# Patient Record
Sex: Male | Born: 1990 | Race: White | Hispanic: No | Marital: Single | Smoking: Former smoker
Health system: Southern US, Community
[De-identification: ages and names within clinical notes are randomized; demographics above are authoritative.]

---
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
## 2012-12-17 ENCOUNTER — Emergency Department: Payer: Self-pay | Admitting: Internal Medicine

## 2012-12-17 LAB — CBC
HCT: 42.6 % (ref 40.0–52.0)
HGB: 14.4 g/dL (ref 13.0–18.0)
MCH: 30.1 pg (ref 26.0–34.0)
MCHC: 33.8 g/dL (ref 32.0–36.0)
MCV: 89 fL (ref 80–100)
Platelet: 167 10*3/uL (ref 150–440)
RBC: 4.78 10*6/uL (ref 4.40–5.90)
RDW: 13.4 % (ref 11.5–14.5)
WBC: 5.8 10*3/uL (ref 3.8–10.6)

## 2012-12-17 LAB — URINALYSIS, COMPLETE
Bacteria: NONE SEEN
Bilirubin,UR: NEGATIVE
Blood: NEGATIVE
Glucose,UR: NEGATIVE mg/dL (ref 0–75)
Ketone: NEGATIVE
Leukocyte Esterase: NEGATIVE
Nitrite: NEGATIVE
Ph: 8 (ref 4.5–8.0)
Protein: NEGATIVE
RBC,UR: <1 /HPF (ref 0–5)
Specific Gravity: 1.019 (ref 1.003–1.030)
Squamous Epithelial: NONE SEEN
WBC UR: <1 /HPF (ref 0–5)

## 2012-12-17 LAB — COMPREHENSIVE METABOLIC PANEL
Albumin: 5 g/dL (ref 3.4–5.0)
Alkaline Phosphatase: 96 U/L (ref 50–136)
Anion Gap: 5 — ABNORMAL LOW (ref 7–16)
BUN: 11 mg/dL (ref 7–18)
Bilirubin,Total: 0.8 mg/dL (ref 0.2–1.0)
Calcium, Total: 9.8 mg/dL (ref 8.5–10.1)
Chloride: 106 mmol/L (ref 98–107)
Co2: 29 mmol/L (ref 21–32)
Creatinine: 1.04 mg/dL (ref 0.60–1.30)
EGFR (African American): >60
EGFR (Non-African Amer.): >60
Glucose: 106 mg/dL — ABNORMAL HIGH (ref 65–99)
Osmolality: 279 (ref 275–301)
Potassium: 4.1 mmol/L (ref 3.5–5.1)
SGOT(AST): 36 U/L (ref 15–37)
SGPT (ALT): 54 U/L (ref 12–78)
Sodium: 140 mmol/L (ref 136–145)
Total Protein: 8.3 g/dL — ABNORMAL HIGH (ref 6.4–8.2)

## 2013-11-29 ENCOUNTER — Emergency Department (HOSPITAL_COMMUNITY): Payer: Self-pay

## 2013-11-29 ENCOUNTER — Emergency Department (HOSPITAL_COMMUNITY)
Admission: EM | Admit: 2013-11-29 | Discharge: 2013-11-29 | Disposition: A | Discharge status: Home / Self Care | Payer: Self-pay | Attending: Emergency Medicine | Admitting: Emergency Medicine

## 2013-11-29 ENCOUNTER — Encounter (HOSPITAL_COMMUNITY): Payer: Self-pay | Admitting: Emergency Medicine

## 2013-11-29 DIAGNOSIS — IMO0002 Reserved for concepts with insufficient information to code with codable children: Secondary | ICD-10-CM | POA: Insufficient documentation

## 2013-11-29 DIAGNOSIS — M545 Low back pain, unspecified: Secondary | ICD-10-CM

## 2013-11-29 DIAGNOSIS — Y9389 Activity, other specified: Secondary | ICD-10-CM | POA: Insufficient documentation

## 2013-11-29 DIAGNOSIS — Y92410 Unspecified street and highway as the place of occurrence of the external cause: Secondary | ICD-10-CM | POA: Insufficient documentation

## 2013-11-29 DIAGNOSIS — S199XXA Unspecified injury of neck, initial encounter: Secondary | ICD-10-CM

## 2013-11-29 DIAGNOSIS — F17200 Nicotine dependence, unspecified, uncomplicated: Secondary | ICD-10-CM | POA: Insufficient documentation

## 2013-11-29 DIAGNOSIS — S060X9A Concussion with loss of consciousness of unspecified duration, initial encounter: Secondary | ICD-10-CM

## 2013-11-29 DIAGNOSIS — S060X0A Concussion without loss of consciousness, initial encounter: Secondary | ICD-10-CM | POA: Insufficient documentation

## 2013-11-29 DIAGNOSIS — S060XAA Concussion with loss of consciousness status unknown, initial encounter: Secondary | ICD-10-CM

## 2013-11-29 DIAGNOSIS — S0993XA Unspecified injury of face, initial encounter: Secondary | ICD-10-CM | POA: Insufficient documentation

## 2013-11-29 DIAGNOSIS — G988 Other disorders of nervous system: Secondary | ICD-10-CM | POA: Insufficient documentation

## 2013-11-29 MED ORDER — CYCLOBENZAPRINE HCL 10 MG PO TABS: 10.0000 mg | ORAL_TABLET | Freq: Two times a day (BID) | ORAL | Status: DC | PRN

## 2013-11-29 MED ORDER — TRAMADOL HCL 50 MG PO TABS: 50.0000 mg | ORAL_TABLET | Freq: Four times a day (QID) | ORAL | Status: DC | PRN
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
## 2013-11-29 NOTE — ED Notes (Addendum)
Pt c/o of dizziness when standing- denies changes in vision.  Alert and oriented X 4- PA aware.  PERRLA.  Acuity changed.
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
## 2013-11-30 NOTE — ED Provider Notes (Signed)
Medical screening examination/treatment/procedure(s) were performed by non-physician practitioner and as supervising physician I was immediately available for consultation/collaboration.     Klint Lezcano, MD 11/30/13 0714 

## 2014-03-08 ENCOUNTER — Emergency Department (HOSPITAL_COMMUNITY)
Admission: EM | Admit: 2014-03-08 | Discharge: 2014-03-08 | Disposition: A | Discharge status: Home / Self Care | Payer: Self-pay | Attending: Emergency Medicine | Admitting: Emergency Medicine

## 2014-03-08 ENCOUNTER — Encounter (HOSPITAL_COMMUNITY): Payer: Self-pay | Admitting: Emergency Medicine

## 2014-03-08 DIAGNOSIS — S90569A Insect bite (nonvenomous), unspecified ankle, initial encounter: Secondary | ICD-10-CM | POA: Insufficient documentation

## 2014-03-08 DIAGNOSIS — Y939 Activity, unspecified: Secondary | ICD-10-CM | POA: Insufficient documentation

## 2014-03-08 DIAGNOSIS — F17200 Nicotine dependence, unspecified, uncomplicated: Secondary | ICD-10-CM | POA: Insufficient documentation

## 2014-03-08 DIAGNOSIS — S80869A Insect bite (nonvenomous), unspecified lower leg, initial encounter: Secondary | ICD-10-CM

## 2014-03-08 DIAGNOSIS — D692 Other nonthrombocytopenic purpura: Secondary | ICD-10-CM | POA: Insufficient documentation

## 2014-03-08 DIAGNOSIS — W57XXXA Bitten or stung by nonvenomous insect and other nonvenomous arthropods, initial encounter: Secondary | ICD-10-CM

## 2014-03-08 DIAGNOSIS — Y929 Unspecified place or not applicable: Secondary | ICD-10-CM | POA: Insufficient documentation

## 2014-03-08 MED ORDER — DOXYCYCLINE HYCLATE 100 MG PO CAPS: ORAL_CAPSULE | ORAL | Status: DC

## 2014-03-08 NOTE — ED Provider Notes (Signed)
CSN: [id number]     Arrival date & time 03/08/14  0957 History   First MD Initiated Contact with Patient 03/08/14 1116     Chief Complaint  Patient presents with  . Rash    Feet     (Consider location/radiation/quality/duration/timing/severity/associated sxs/prior Treatment) Patient is a 23 y.o. male presenting with rash. The history is provided by the patient. No language interpreter was used.  Rash Location:  Foot and leg Leg rash location:  R ankle and L ankle Foot rash location:  R foot Quality comment:  Non-blanching. purpuric Severity:  Mild Onset quality:  Sudden Duration:  2 days Timing:  Constant Progression:  Worsening Chronicity:  New Context: not animal contact, not chemical exposure, not diapers, not eggs, not exposure to similar rash, not food, not hot tub use, not insect bite/sting, not medications, not new detergent/soap, not nuts, not plant contact, not pollen, not pregnancy, not sick contacts and not sun exposure   Context comment:  [place] bite 1 week ago Relieved by:  Nothing Worsened by:  Nothing tried Associated symptoms: no abdominal pain, no diarrhea, no fatigue, no fever, no headaches, no hoarse voice, no induration, no joint pain, no myalgias, no nausea, no periorbital edema, no shortness of breath, no sore throat, no throat swelling, no tongue swelling, no URI, not vomiting and not wheezing     History reviewed. No pertinent past medical history. History reviewed. No pertinent past surgical history. No family history on file. History  Substance Use Topics  . Smoking status: Current Every Day Smoker  . Smokeless tobacco: Not on file  . Alcohol Use: Yes    Review of Systems  Constitutional: Negative for fever, chills and fatigue.  HENT: Negative for hoarse voice and sore throat.   Respiratory: Negative for cough, shortness of breath and wheezing.   Cardiovascular: Negative for chest pain and palpitations.  Gastrointestinal: Negative for nausea,  vomiting, abdominal pain, diarrhea and constipation.  Genitourinary: Negative for dysuria, urgency and frequency.  Musculoskeletal: Negative for arthralgias and myalgias.  Skin: Positive for rash.  Neurological: Negative for headaches.      Allergies  Review of patient's allergies indicates no known allergies.  Home Medications   Prior to Admission medications   Not on File   BP 129/103  Pulse 86  Temp(Src) 98.2 F (36.8 C) (Oral)  Resp 18  SpO2 100% Physical Exam  Nursing note and vitals reviewed. Constitutional: He appears well-developed and well-nourished. No distress.  HENT:  Head: Normocephalic and atraumatic.  Eyes: Conjunctivae are normal. No scleral icterus.  Neck: Normal range of motion. Neck supple.  Cardiovascular: Normal rate, regular rhythm and normal heart sounds.   Pulmonary/Chest: Effort normal and breath sounds normal. No respiratory distress.  Abdominal: Soft. There is no tenderness.  Musculoskeletal: He exhibits no edema.  Neurological: He is alert.  Skin: Skin is warm and dry. He is not diaphoretic.  Several poorly demarcated, non-blanchable 0.5-1cm lesions on the Left ankle.  Petechiae present distally. 2 lesions on the  Right ankle.  Psychiatric: His behavior is normal.    ED Course  Procedures (including critical care time) Labs Review Labs Reviewed - No data to display  Imaging Review No results found.   EKG Interpretation None      MDM   Final diagnoses:  Tick bite of calf  Purpura    BP 129/103  Pulse 86  Temp(Src) 98.2 F (36.8 C) (Oral)  Resp 18  SpO2 100% 11:50 AM Patient afebrile. Documented pressure  is thought to be erroneous. Retested with cuff place appropriately and found to be 134/78.    11:55 AM BP 129/103  Pulse 86  Temp(Src) 98.2 F (36.8 C) (Oral)  Resp 18  SpO2 100% I discussed the case with my attending physician Dr. Ethelda ChickJacubowitz. Will treat fro [facility]F. I also spoke wit Dr. Elmon KirschnerHatcher oncall for  infectious disease about dosing. WIll treat patient with doxycycline 100mg  BID x14 days.   Arthor Captainbigail Cyera Balboni, PA-C 03/08/14 1158

## 2014-03-08 NOTE — ED Notes (Signed)
Patient waiting for case management to come speak with him about medication alternatives for antibiotic.

## 2014-03-08 NOTE — ED Provider Notes (Signed)
Medical screening examination/treatment/procedure(s) were performed by non-physician practitioner and as supervising physician I was immediately available for consultation/collaboration.   EKG Interpretation None       Doug SouSam Tavio Biegel, MD 03/08/14 2048

## 2014-03-08 NOTE — Discharge Planning (Signed)
[facility] Community Liaison  Spoke to patient about primary care resources and establishing care with a provider. Patient sts he will be obtaining insurance through his employer in the upcoming months. Resource guide and my contact information was provided.

## 2014-03-08 NOTE — ED Notes (Signed)
Pt found tick on him on him last Sunday to left lower posterior leg.  Pt removed it and then had vomiting on Monday.  Pt states noted rash to bilateral ankle areas after tick removal.  Pt states not feeling different, concerned about [place] fever

## 2014-03-08 NOTE — Discharge Instructions (Signed)
Tick Bite Information Ticks are insects that attach themselves to the skin and draw blood for food. There are various types of ticks. Common types include wood ticks and deer ticks. Most ticks live in shrubs and grassy areas. Ticks can climb onto your body when you make contact with leaves or grass where the tick is waiting. The most common places on the body for ticks to attach themselves are the scalp, neck, armpits, waist, and groin. Most tick bites are harmless, but sometimes ticks carry germs that cause diseases. These germs can be spread to a person during the tick's feeding process. The chance of a disease spreading through a tick bite depends on:  The type of tick. Time of year.  How long the tick is attached.  Geographic location.  HOW CAN YOU PREVENT TICK BITES? Take these steps to help prevent tick bites when you are outdoors: Wear protective clothing. Long sleeves and long pants are best.  Wear white clothes so you can see ticks more easily. Tuck your pant legs into your socks.  If walking on a trail, stay in the middle of the trail to avoid brushing against bushes. Avoid walking through areas with long grass. Put insect repellent on all exposed skin and along boot tops, pant legs, and sleeve cuffs.  Check clothing, hair, and skin repeatedly and before going inside.  Brush off any ticks that are not attached. Take a shower or bath as soon as possible after being outdoors.  WHAT IS THE PROPER WAY TO REMOVE A TICK? Ticks should be removed as soon as possible to help prevent diseases caused by tick bites. If latex gloves are available, put them on before trying to remove a tick.  Using fine-point tweezers, grasp the tick as close to the skin as possible. You may also use curved forceps or a tick removal tool. Grasp the tick as close to its head as possible. Avoid grasping the tick on its body. Pull gently with steady upward pressure until the tick lets go. Do not twist the  tick or jerk it suddenly. This may break off the tick's head or mouth parts. Do not squeeze or crush the tick's body. This could force disease-carrying fluids from the tick into your body.  After the tick is removed, wash the bite area and your hands with soap and water or other disinfectant such as alcohol. Apply a small amount of antiseptic cream or ointment to the bite site.  Wash and disinfect any instruments that were used.  Do not try to remove a tick by applying a hot match, petroleum jelly, or fingernail polish to the tick. These methods do not work and may increase the chances of disease being spread from the tick bite.  WHEN SHOULD YOU SEEK MEDICAL CARE? Contact your health care provider if you are unable to remove a tick from your skin or if a part of the tick breaks off and is stuck in the skin.  After a tick bite, you need to be aware of signs and symptoms that could be related to diseases spread by ticks. Contact your health care provider if you develop any of the following in the days or weeks after the tick bite: Unexplained fever. Rash. A circular rash that appears days or weeks after the tick bite may indicate the possibility of Lyme disease. The rash may resemble a target with a bull's-eye and may occur at a different part of your body than the tick bite. Redness and swelling  in the area of the tick bite.  Tender, swollen lymph glands.  Diarrhea.  Weight loss.  Cough.  Fatigue.  Muscle, joint, or bone pain.  Abdominal pain.  Headache.  Lethargy or a change in your level of consciousness. Difficulty walking or moving your legs.  Numbness in the legs.  Paralysis. Shortness of breath.  Confusion.  Repeated vomiting.  Document Released: 10/11/2000 Document Revised: 08/04/2013 Document Reviewed: 03/24/2013 [facility] Patient Information 2014 [place]. [place] Spotted Fever [place] Spotted Fever (RMSF) is the oldest known tick-borne  disease of people in the [place]. This disease was named because it was first described among people in the [facility] area who had an illness characterized by a rash with red-purple-black spots. This disease is caused by a rickettsia (Rickettsia rickettsii), a bacteria carried by the tick. [facility] wood tick and the American dog tick, acquire and transmit the RMSF bacteria (pictures NOT actual size). When a larval, nymphal or adult tick feeds on an infected rodent or larger animal, the tick can become infected. Infected adult ticks then feed on people who may then get RMSF. The tick transmits the disease to humans during a prolonged period of feeding that lasts many hours, days or even a couple weeks. The bite is painless and frequently goes unnoticed. An infected male tick may also pass the rickettsial bacteria to her eggs that then may mature to be infected adult ticks. The rickettsia that causes RMSF can also get into a person's body through damaged skin. A tick bite is not necessary. People can get RMSF if they crush a tick and get it's blood or body fluids on their skin through a small cut or sore.  DIAGNOSIS Diagnosis is made by laboratory tests.  TREATMENT Treatment is with antibiotics (medications that kill rickettsia and other bacteria). Immediate treatment usually prevents death. GEOGRAPHIC RANGE This disease was reported only in the [facility] until 1931. RMSF has more recently been described among individuals in all states except [place], [place] and [place]. The highest reported incidences of RMSF now occur among residents of [place], [place], [place] and the [place]. TIME OF YEAR  Most cases are diagnosed during late spring and summer when ticks are most active. However, especially in the warmer [place] states, a few cases occur during the winter. SYMPTOMS   Symptoms of RMSF begin from 2 to 14 days after a tick bite. The most common early symptoms are fever,  muscle aches and headache followed by nausea (feeling sick to your stomach) or vomiting.  The RMSF rash is typically delayed until 3 or more days after symptom onset, and eventually develops in 9 of 10 infected patients by the 5th day of illness. If the disease is not treated it can cause death. If you get a fever, headache, muscle aches, rash, nausea or vomiting within 2 weeks of a possible tick bite or exposure you should see your caregiver immediately. PREVENTION Ticks prefer to hide in shady, moist ground litter. They can often be found above the ground clinging to tall grass, brush, shrubs and low tree branches. They also inhabit lawns and gardens, especially at the edges of woodlands and around old stone walls. Within the areas where ticks generally live, no naturally vegetated area can be considered completely free of infected ticks. The best precaution against RMSF is to avoid contact with soil, leaf litter and vegetation as much as possible in tick infested areas. For those who enjoy gardening or walking in their  yards, clear brush and mow tall grass around houses and at the edges of gardens. This may help reduce the tick population in the immediate area. Applications of chemical insecticides by a licensed professional in the spring (late May) and Fall (September) will also control ticks, especially in heavily infested areas. Treatment will never get rid of all the ticks. Getting rid of small animal populations that host ticks will also decrease the tick population. When working in the garden, [facility], or handling soil and vegetation, wear light-colored protective clothing and gloves. Spot-check often to prevent ticks from reaching the skin. Ticks cannot jump or fly. They will not drop from an above-ground perch onto a passing animal. Once a tick gains access to human skin it climbs upward until it reaches a more protected area. For example, the back of the knee, groin, navel, armpit, ears or  nape of the neck. It then begins the slow process of embedding itself in the skin. Campers, hikers, field workers, and others who spend time in wooded, brushy or tall grassy areas can avoid exposure to ticks by using the following precautions:  Wear light-colored clothing with a tight weave to spot ticks more easily and prevent contact with the skin.  Wear long pants tucked into socks, long-sleeved shirts tucked into pants and enclosed shoes or boots along with insect repellent.  Spray clothes with insect repellent containing either DEET or Permethrin. Only DEET can be used on exposed skin. Follow the manufacturer's directions carefully.  Wear a hat and keep long hair pulled back.  Stay on cleared, well-worn trails whenever possible.  Spot-check yourself and others often for the presence of ticks on clothes. If you find one, there are likely to be others. Check thoroughly.  Remove clothes after leaving tick-infested areas. If possible, wash them to eliminate any unseen ticks. Check yourself, your children and any pets from head to toe for the presence of ticks.  Shower and shampoo. You can greatly reduce your chances of contracting RMSF if you remove attached ticks as soon as possible. Regular checks of the body, including all body sites covered by hair (head, armpits, genitals), allow removal of the tick before rickettsial transmission. To remove an attached tick, use a forceps or tweezers to detach the intact tick without leaving mouth parts in the skin. The tick bite wound should be cleansed after tick removal. Remember the most common symptoms of RMSF are fever, muscle aches, headache and nausea or vomiting with a later onset of rash. If you get these symptoms after a tick bite and while living in an area where RMSF is found, RMSF should be suspected. If the disease is not treated, it can cause death. See your caregiver immediately if you get these symptoms. Do this even if not aware of a tick  bite. Document Released: 01/26/2001 Document Revised: 01/06/2012 Document Reviewed: 09/18/2009 [facility] Patient Information 2014 [place], [place]. Doxycycline tablets or capsules What is this medicine? DOXYCYCLINE (dox i SYE kleen) is a tetracycline antibiotic. It kills certain bacteria or stops their growth. It is used to treat many kinds of infections, like dental, skin, respiratory, and urinary tract infections. It also treats acne, Lyme disease, malaria, and certain sexually transmitted infections. This medicine may be used for other purposes; ask your health care provider or pharmacist if you have questions. COMMON BRAND NAME(S): Adoxa CK, Adoxa Pak, Adoxa TT, Adoxa, Alodox, Avidoxy, Doxal, Monodox, Morgidox 1x Kit, Morgidox 1x, Morgidox 2x , Morgidox 2x Kit, Ocudox , Vibra-Tabs, Vibramycin  What should I tell my health care provider before I take this medicine? They need to know if you have any of these conditions: -liver disease -long exposure to sunlight like working outdoors -stomach problems like colitis -an unusual or allergic reaction to doxycycline, tetracycline antibiotics, other medicines, foods, dyes, or preservatives -pregnant or trying to get pregnant -breast-feeding How should I use this medicine? Take this medicine by mouth with a full glass of water. Follow the directions on the prescription label. It is best to take this medicine without food, but if it upsets your stomach take it with food. Take your medicine at regular intervals. Do not take your medicine more often than directed. Take all of your medicine as directed even if you think you are better. Do not skip doses or stop your medicine early. Talk to your pediatrician regarding the use of this medicine in children. Special care may be needed. While this drug may be prescribed for children as young as 66 years old for selected conditions, precautions do apply. Overdosage: If you think you have taken too much of this  medicine contact a poison control center or emergency room at once. NOTE: This medicine is only for you. Do not share this medicine with others. What if I miss a dose? If you miss a dose, take it as soon as you can. If it is almost time for your next dose, take only that dose. Do not take double or extra doses. What may interact with this medicine? -antacids -barbiturates -birth control pills -bismuth subsalicylate -carbamazepine -methoxyflurane -other antibiotics -phenytoin -vitamins that contain iron -warfarin This list may not describe all possible interactions. Give your health care provider a list of all the medicines, herbs, non-prescription drugs, or dietary supplements you use. Also tell them if you smoke, drink alcohol, or use illegal drugs. Some items may interact with your medicine. What should I watch for while using this medicine? Tell your doctor or health care professional if your symptoms do not improve. Do not treat diarrhea with over the counter products. Contact your doctor if you have diarrhea that lasts more than 2 days or if it is severe and watery. Do not take this medicine just before going to bed. It may not dissolve properly when you lay down and can cause pain in your throat. Drink plenty of fluids while taking this medicine to also help reduce irritation in your throat. This medicine can make you more sensitive to the sun. Keep out of the sun. If you cannot avoid being in the sun, wear protective clothing and use sunscreen. Do not use sun lamps or tanning beds/booths. Birth control pills may not work properly while you are taking this medicine. Talk to your doctor about using an extra method of birth control. If you are being treated for a sexually transmitted infection, avoid sexual contact until you have finished your treatment. Your sexual partner may also need treatment. Avoid antacids, aluminum, calcium, magnesium, and iron products for 4 hours before and 2  hours after taking a dose of this medicine. If you are using this medicine to prevent malaria, you should still protect yourself from contact with mosquitos. Stay in screened-in areas, use mosquito nets, keep your body covered, and use an insect repellent. What side effects may I notice from receiving this medicine? Side effects that you should report to your doctor or health care professional as soon as possible: -allergic reactions like skin rash, itching or hives, swelling of the face, lips, or  tongue -difficulty breathing -fever -itching in the rectal or genital area -pain on swallowing -redness, blistering, peeling or loosening of the skin, including inside the mouth -severe stomach pain or cramps -unusual bleeding or bruising -unusually weak or tired -yellowing of the eyes or skin Side effects that usually do not require medical attention (report to your doctor or health care professional if they continue or are bothersome): -diarrhea -loss of appetite -nausea, vomiting This list may not describe all possible side effects. Call your doctor for medical advice about side effects. You may report side effects to FDA at 1-800-FDA-1088. Where should I keep my medicine? Keep out of the reach of children. Store at room temperature, below 30 degrees C (86 degrees F). Protect from light. Keep container tightly closed. Throw away any unused medicine after the expiration date. Taking this medicine after the expiration date can make you seriously ill. NOTE: This sheet is a summary. It may not cover all possible information. If you have questions about this medicine, talk to your doctor, pharmacist, or health care provider.  2014, Elsevier/[employer] Standard. (2008-02-02 16:53:02)

## 2014-03-08 NOTE — Progress Notes (Signed)
  CARE MANAGEMENT ED NOTE 03/08/2014  Patient:  Andrew Brewer,Andrew G   Account Number:  [id number]  Date Initiated:  03/08/2014  Documentation initiated by:  Ferdinand CavaSCHETTINO,Rodolph Hagemann  Subjective/Objective Assessment:   23 yo male presenting to the ED with an insect bite     Subjective/Objective Assessment Detail:   Patient is a 23 y.o. male presenting with rash. The history is provided by the patient. No language interpreter was used     Action/Plan:   Action/Plan Detail:   Anticipated DC Date:       Status Recommendation to Physician:   Result of Recommendation:  Agreed    DC Planning Services  CM consult  MATCH Program    Choice offered to / List presented to:            Status of service:    ED Comments:   ED Comments Detail:  CM consulted to assist patient with medications. CM spoke with patient and he stated that he does not have a PCP or insurance. He did state that he started a new job last week and is eligible for insurance benefits in 6 months and he plans on pursuing benefits. Provided the patient with a resource list for community clinics to establish with a PCP until he is eligible for insurance coverage. Discussed the [facility] with the patient to cover the discharge medications with a $3 copay explaining that [facility] are only available one time within a year and therefore it is important to get established with a PCP and medication assistance. Reviewed the list of participating pharmacies [facility] and notified the staff nurse that [facility] pending discharge prescriptions and paperwork.

## 2014-05-10 ENCOUNTER — Emergency Department: Payer: Self-pay | Admitting: Emergency Medicine

## 2016-04-23 ENCOUNTER — Emergency Department
Admission: EM | Admit: 2016-04-23 | Discharge: 2016-04-23 | Disposition: A | Discharge status: Home / Self Care | Payer: No Typology Code available for payment source | Attending: Emergency Medicine | Admitting: Emergency Medicine

## 2016-04-23 DIAGNOSIS — Y939 Activity, unspecified: Secondary | ICD-10-CM | POA: Insufficient documentation

## 2016-04-23 DIAGNOSIS — W57XXXA Bitten or stung by nonvenomous insect and other nonvenomous arthropods, initial encounter: Secondary | ICD-10-CM | POA: Insufficient documentation

## 2016-04-23 DIAGNOSIS — F17210 Nicotine dependence, cigarettes, uncomplicated: Secondary | ICD-10-CM | POA: Insufficient documentation

## 2016-04-23 DIAGNOSIS — R590 Localized enlarged lymph nodes: Secondary | ICD-10-CM | POA: Insufficient documentation

## 2016-04-23 DIAGNOSIS — Y999 Unspecified external cause status: Secondary | ICD-10-CM | POA: Insufficient documentation

## 2016-04-23 DIAGNOSIS — Y929 Unspecified place or not applicable: Secondary | ICD-10-CM | POA: Insufficient documentation

## 2016-04-23 DIAGNOSIS — S40262A Insect bite (nonvenomous) of left shoulder, initial encounter: Secondary | ICD-10-CM | POA: Insufficient documentation

## 2016-04-23 MED ORDER — DOXYCYCLINE HYCLATE 50 MG PO CAPS: 100.0000 mg | ORAL_CAPSULE | Freq: Two times a day (BID) | ORAL | Status: DC

## 2016-04-23 MED ORDER — IBUPROFEN 800 MG PO TABS: 800.0000 mg | ORAL_TABLET | Freq: Three times a day (TID) | ORAL | Status: DC | PRN

## 2016-04-23 NOTE — ED Notes (Signed)
Pt states he pulled a tick off of himself on the left shoulder yesterday.. Today noticed swelling of the cervical nodes. Denies N/V/D/fever

## 2016-04-23 NOTE — ED Notes (Signed)
Se triage note   Removed tick from left shoulder yesterday  Area is tender/red

## 2016-04-23 NOTE — Discharge Instructions (Signed)
Tick Bite Information Ticks are insects that attach themselves to the skin and draw blood for food. There are various types of ticks. Common types include wood ticks and deer ticks. Most ticks live in shrubs and grassy areas. Ticks can climb onto your body when you make contact with leaves or grass where the tick is waiting. The most common places on the body for ticks to attach themselves are the scalp, neck, armpits, waist, and groin. Most tick bites are harmless, but sometimes ticks carry germs that cause diseases. These germs can be spread to a person during the tick's feeding process. The chance of a disease spreading through a tick bite depends on:   The type of tick.  Time of year.   How long the tick is attached.   Geographic location.  HOW CAN YOU PREVENT TICK BITES? Take these steps to help prevent tick bites when you are outdoors:  Wear protective clothing. Long sleeves and long pants are best.   Wear white clothes so you can see ticks more easily.  Tuck your pant legs into your socks.   If walking on a trail, stay in the middle of the trail to avoid brushing against bushes.  Avoid walking through areas with long grass.  Put insect repellent on all exposed skin and along boot tops, pant legs, and sleeve cuffs.   Check clothing, hair, and skin repeatedly and before going inside.   Brush off any ticks that are not attached.  Take a shower or bath as soon as possible after being outdoors.  WHAT IS THE PROPER WAY TO REMOVE A TICK? Ticks should be removed as soon as possible to help prevent diseases caused by tick bites. 1. If latex gloves are available, put them on before trying to remove a tick.  2. Using fine-point tweezers, grasp the tick as close to the skin as possible. You may also use curved forceps or a tick removal tool. Grasp the tick as close to its head as possible. Avoid grasping the tick on its body. 3. Pull gently with steady upward pressure until  the tick lets go. Do not twist the tick or jerk it suddenly. This may break off the tick's head or mouth parts. 4. Do not squeeze or crush the tick's body. This could force disease-carrying fluids from the tick into your body.  5. After the tick is removed, wash the bite area and your hands with soap and water or other disinfectant such as alcohol. 6. Apply a small amount of antiseptic cream or ointment to the bite site.  7. Wash and disinfect any instruments that were used.  Do not try to remove a tick by applying a hot match, petroleum jelly, or fingernail polish to the tick. These methods do not work and may increase the chances of disease being spread from the tick bite.  WHEN SHOULD YOU SEEK MEDICAL CARE? Contact your health care provider if you are unable to remove a tick from your skin or if a part of the tick breaks off and is stuck in the skin.  After a tick bite, you need to be aware of signs and symptoms that could be related to diseases spread by ticks. Contact your health care provider if you develop any of the following in the days or weeks after the tick bite:  Unexplained fever.  Rash. A circular rash that appears days or weeks after the tick bite may indicate the possibility of Lyme disease. The rash may resemble   a target with a bull's-eye and may occur at a different part of your body than the tick bite.  Redness and swelling in the area of the tick bite.   Tender, swollen lymph glands.   Diarrhea.   Weight loss.   Cough.   Fatigue.   Muscle, joint, or bone pain.   Abdominal pain.   Headache.   Lethargy or a change in your level of consciousness.  Difficulty walking or moving your legs.   Numbness in the legs.   Paralysis.  Shortness of breath.   Confusion.   Repeated vomiting.    This information is not intended to replace advice given to you by your health care provider. Make sure you discuss any questions you have with your health  care provider.   Document Released: 10/11/2000 Document Revised: 11/04/2014 Document Reviewed: 03/24/2013 Elsevier Interactive Patient Education 2016 [employer]  

## 2016-04-23 NOTE — ED Provider Notes (Signed)
[facility] Emergency Department Provider Note  ____________________________________________  Time seen: Approximately 10:40 AM  I have reviewed the triage vital signs and the nursing notes.   HISTORY  Chief Complaint Tick Removal    HPI Andrew Brewer is a 25 y.o. male who reports being hit by a tick yesterday. Patient states he had a tick on his left shoulder that he removed himself. The area is tender and red today he complains of having some cervical lymph node swelling and tenderness. Denies any fever chills nausea vomiting. Rates pain minimally is a 2/10.   History reviewed. No pertinent past medical history.  There are no active problems to display for this patient.   History reviewed. No pertinent past surgical history.  Current Outpatient Rx  Name  Route  Sig  Dispense  Refill  . doxycycline (VIBRAMYCIN) 50 MG capsule   Oral   Take 2 capsules (100 mg total) by mouth 2 (two) times daily.   40 capsule   0   . ibuprofen (ADVIL,MOTRIN) 800 MG tablet   Oral   Take 1 tablet (800 mg total) by mouth every 8 (eight) hours as needed.   30 tablet   0     Allergies Review of patient's allergies indicates no known allergies.  No family history on file.  Social History Social History  Substance Use Topics  . Smoking status: Current Every Day Smoker    Types: Cigarettes  . Smokeless tobacco: None  . Alcohol Use: Yes    Review of Systems Constitutional: No fever/chills Eyes: No visual changes. ENT: No sore throat.Positive for lymph node swelling on the left neck. Cardiovascular: Denies chest pain. Respiratory: Denies shortness of breath. Gastrointestinal: No abdominal pain.  No nausea, no vomiting.  No diarrhea.  No constipation. Musculoskeletal: Negative for back pain. Skin: Positive for tick bite to the left shoulder. Neurological: Negative for headaches, focal weakness or numbness.  10-point ROS otherwise  negative.  ____________________________________________   PHYSICAL EXAM:  VITAL SIGNS: ED Triage Vitals  Enc Vitals Group     BP 04/23/16 1020 131/72 mmHg     Pulse Rate 04/23/16 1020 72     Resp 04/23/16 1020 16     Temp 04/23/16 1020 98.1 F (36.7 C)     Temp Source 04/23/16 1021 Oral     SpO2 04/23/16 1020 99 %     Weight 04/23/16 1020 165 lb (74.844 kg)     Height 04/23/16 1020 5\' 10"  (1.778 m)     Head Cir --      Peak Flow --      Pain Score 04/23/16 1021 2     Pain Loc --      Pain Edu? --      Excl. in GC? --     Constitutional: Alert and oriented. Well appearing and in no acute distress. Head: Atraumatic. Nose: No congestion/rhinnorhea. Mouth/Throat: Mucous membranes are moist.  Oropharynx non-erythematous. Neck: No stridor. Positive anterior cervical adenopathy on the left.  Cardiovascular: Normal rate, regular rhythm. Grossly normal heart sounds.  Good peripheral circulation. Respiratory: Normal respiratory effort.  No retractions. Lungs CTAB. Neurologic:  Normal speech and language. No gross focal neurologic deficits are appreciated. No gait instability. Skin:  Skin is warm, dry and intact. No rash noted. Small area of erythema and tenderness noted to the left shoulder. Psychiatric: Mood and affect are normal. Speech and behavior are normal.  ____________________________________________   LABS (all labs ordered are listed, but only  abnormal results are displayed)  Labs Reviewed - No data to display   PROCEDURES  Procedure(s) performed: None  Critical Care performed: No  ____________________________________________   INITIAL IMPRESSION / ASSESSMENT AND PLAN / ED COURSE  Pertinent labs & imaging results that were available during my care of the patient were reviewed by me and considered in my medical decision making (see chart for details).  Tick bite with cervical lymphadenopathy. Rx given for doxycycline 100 mg twice a day for 10 days. Ibuprofen  800 mg 3 times a day as needed. Patient follow-up with PCP or return to the ER with any worsening ____________________________________________   FINAL CLINICAL IMPRESSION(S) / ED DIAGNOSES  Final diagnoses:  Tick bite     This chart was dictated using voice recognition software/Dragon. Despite best efforts to proofread, errors can occur which can change the meaning. Any change was purely unintentional.   Evangeline Dakinharles M Oliveah Zwack, PA-C 04/23/16 1114  Emily FilbertJonathan E Williams, MD 04/23/16 [phone]

## 2016-07-19 ENCOUNTER — Emergency Department: Payer: Self-pay

## 2016-07-19 ENCOUNTER — Emergency Department
Admission: EM | Admit: 2016-07-19 | Discharge: 2016-07-19 | Disposition: A | Discharge status: Home / Self Care | Payer: Self-pay | Attending: Emergency Medicine | Admitting: Emergency Medicine

## 2016-07-19 ENCOUNTER — Encounter: Payer: Self-pay | Admitting: Emergency Medicine

## 2016-07-19 DIAGNOSIS — F17210 Nicotine dependence, cigarettes, uncomplicated: Secondary | ICD-10-CM | POA: Insufficient documentation

## 2016-07-19 DIAGNOSIS — Y999 Unspecified external cause status: Secondary | ICD-10-CM | POA: Insufficient documentation

## 2016-07-19 DIAGNOSIS — Y9289 Other specified places as the place of occurrence of the external cause: Secondary | ICD-10-CM | POA: Insufficient documentation

## 2016-07-19 DIAGNOSIS — Y9389 Activity, other specified: Secondary | ICD-10-CM | POA: Insufficient documentation

## 2016-07-19 DIAGNOSIS — S93602A Unspecified sprain of left foot, initial encounter: Secondary | ICD-10-CM | POA: Insufficient documentation

## 2016-07-19 DIAGNOSIS — X501XXA Overexertion from prolonged static or awkward postures, initial encounter: Secondary | ICD-10-CM | POA: Insufficient documentation

## 2016-07-19 MED ORDER — HYDROCODONE-ACETAMINOPHEN 5-325 MG PO TABS: 1.0000 | ORAL_TABLET | ORAL | 0 refills | Status: DC | PRN

## 2016-07-19 MED ORDER — IBUPROFEN 800 MG PO TABS: 800.0000 mg | ORAL_TABLET | Freq: Three times a day (TID) | ORAL | 0 refills | Status: DC | PRN

## 2016-07-19 MED ORDER — OXYCODONE-ACETAMINOPHEN 5-325 MG PO TABS
1.0000 | ORAL_TABLET | Freq: Once | ORAL | Status: AC
  Administered 2016-07-19: 1 via ORAL
  Filled 2016-07-19: qty 1

## 2016-07-19 NOTE — ED Provider Notes (Signed)
[facility] Emergency Department Provider Note  ____________________________________________  Time seen: Approximately 6:08 PM  I have reviewed the triage vital signs and the nursing notes.   HISTORY  Chief Complaint Foot Injury    HPI Andrew Brewer is a 25 y.o. male presents for evaluation of left foot pain. Patient states that he missed a step coming down and twisted his foot causing severe pain. States able to walk but with increasing difficulty.   History reviewed. No pertinent past medical history.  There are no active problems to display for this patient.   History reviewed. No pertinent surgical history.  Prior to Admission medications   Medication Sig Start Date End Date Taking? Authorizing Provider  HYDROcodone-acetaminophen (NORCO) 5-325 MG tablet Take 1-2 tablets by mouth every 4 (four) hours as needed for moderate pain. 07/19/16   Charmayne Sheerharles M Beers, PA-C  ibuprofen (ADVIL,MOTRIN) 800 MG tablet Take 1 tablet (800 mg total) by mouth every 8 (eight) hours as needed. 07/19/16   Evangeline Dakinharles M Beers, PA-C    Allergies Review of patient's allergies indicates no known allergies.  History reviewed. No pertinent family history.  Social History Social History  Substance Use Topics  . Smoking status: Current Every Day Smoker    Packs/day: 1.00    Types: Cigarettes  . Smokeless tobacco: Not on file  . Alcohol use Yes    Review of Systems Constitutional: No fever/chills` Cardiovascular: Denies chest pain. Respiratory: Denies shortness of breath. Musculoskeletal: Left foot pain. Skin: Negative for rash. Neurological: Negative for headaches, focal weakness or numbness.  10-point ROS otherwise negative.  ____________________________________________   PHYSICAL EXAM: BP (!) 127/97 (BP Location: Left Arm)   Pulse (!) 101   Temp 99.1 F (37.3 C) (Oral)   Resp 18   Ht 5\' 10"  (1.778 m)   Wt 77.1 kg   SpO2 97%   BMI 24.39 kg/m   VITAL  SIGNS: ED Triage Vitals  Enc Vitals Group     BP      Pulse      Resp      Temp      Temp src      SpO2      Weight      Height      Head Circumference      Peak Flow      Pain Score      Pain Loc      Pain Edu?      Excl. in GC?     Constitutional: Alert and oriented. Well appearing and in no acute distress.  Cardiovascular: Normal rate, regular rhythm. Grossly normal heart sounds.  Good peripheral circulation. Respiratory: Normal respiratory effort.  No retractions. Lungs CTAB. Musculoskeletal: Left foot with point tenderness noted to the lateral aspect of his foot with some mild edema. Distally neurovascularly intact with good capillary refill. No ecchymosis or bruising noted. Neurologic:  Normal speech and language. No gross focal neurologic deficits are appreciated. No gait instability. Skin:  Skin is warm, dry and intact. No rash noted. Psychiatric: Mood and affect are normal. Speech and behavior are normal.  ____________________________________________   LABS (all labs ordered are listed, but only abnormal results are displayed)  Labs Reviewed - No data to display ____________________________________________  EKG   ____________________________________________  RADIOLOGY  No acute osseous findings. ____________________________________________   PROCEDURES  Procedure(s) performed: None  Critical Care performed: No  ____________________________________________   INITIAL IMPRESSION / ASSESSMENT AND PLAN / ED COURSE  Pertinent labs &  imaging results that were available during my care of the patient were reviewed by me and considered in my medical decision making (see chart for details). Review of the  [place] was performed in accordance of the [place] prior to dispensing any controlled drugs.   Acute left foot contusion/sprain. Rx given for ibuprofen 800 mg 3 times a day Ace wrap provided. Patient follow-up PCP or return to ER with worsening  symptomology.  Clinical Course    ____________________________________________   FINAL CLINICAL IMPRESSION(S) / ED DIAGNOSES  Final diagnoses:  Foot sprain, left, initial encounter     This chart was dictated using voice recognition software/Dragon. Despite best efforts to proofread, errors can occur which can change the meaning. Any change was purely unintentional.   Evangeline Dakin, PA-C 07/19/16 1910    Evangeline Dakin, PA-C 07/19/16 1911    Loleta Rose, MD 07/19/16 [phone]

## 2016-07-19 NOTE — Discharge Instructions (Signed)
Use crutches as needed.

## 2016-07-19 NOTE — ED Notes (Signed)
Patient states he "was walking down the stairs carrying his twins in their car seat" he "missed a step and his foot bent back"

## 2018-03-02 ENCOUNTER — Encounter: Payer: Self-pay | Admitting: Emergency Medicine

## 2018-03-02 ENCOUNTER — Emergency Department
Admission: EM | Admit: 2018-03-02 | Discharge: 2018-03-02 | Disposition: A | Discharge status: Home / Self Care | Payer: Self-pay | Attending: Emergency Medicine | Admitting: Emergency Medicine

## 2018-03-02 DIAGNOSIS — B029 Zoster without complications: Secondary | ICD-10-CM | POA: Insufficient documentation

## 2018-03-02 DIAGNOSIS — F17210 Nicotine dependence, cigarettes, uncomplicated: Secondary | ICD-10-CM | POA: Insufficient documentation

## 2018-03-02 MED ORDER — NAPROXEN 500 MG PO TABS: 500.0000 mg | ORAL_TABLET | Freq: Two times a day (BID) | ORAL | 0 refills | Status: DC

## 2018-03-02 MED ORDER — HYDROCODONE-ACETAMINOPHEN 5-325 MG PO TABS: 1.0000 | ORAL_TABLET | Freq: Four times a day (QID) | ORAL | 0 refills | Status: DC | PRN

## 2018-03-02 NOTE — Discharge Instructions (Addendum)
Begin taking naproxen 500 mg twice daily with food and Norco every 6 hours as needed for pain.  Call the clinics listed on your discharge papers [facility] to see if you can establish primary care there.  This time there is no medications for your rash.  The lymph node on the left side of your neck needs to be reevaluated in approximately 2 to 3 weeks.

## 2018-03-02 NOTE — ED Notes (Addendum)
See triage note  States he has had lyme disease about 3 years ago  States he thinks he is having some of the same sx's  Headache and swelling to left side of neck.  Also has small area of rash to back  Denies any fever or tick bite afebrile on arrival

## 2018-03-02 NOTE — ED Provider Notes (Signed)
[facility] Emergency Department Provider Note   ____________________________________________   First MD Initiated Contact with Patient 03/02/18 [phone]     (approximate)  I have reviewed the triage vital signs and the nursing notes.   HISTORY  Chief Complaint Rash and Generalized Body Aches   HPI Andrew Brewer is a 27 y.o. male is here with a rash to his left posterior shoulder that started last week.  He also has a lymph node that is swollen to the left side of his neck.  He states he has been diagnosed with Lyme's disease several years ago and wonders if this is a relapse.  He denies any previous tick bites or insect bites.  He states that it is a burning sensation that occasionally itches.  Currently rates his pain as 2/10.  History reviewed. No pertinent past medical history.  There are no active problems to display for this patient.   History reviewed. No pertinent surgical history.  Prior to Admission medications   Medication Sig Start Date End Date Taking? Authorizing Provider  HYDROcodone-acetaminophen (NORCO) 5-325 MG tablet Take 1 tablet by mouth every 6 (six) hours as needed for moderate pain. 03/02/18   Tommi Rumps, PA-C  naproxen (NAPROSYN) 500 MG tablet Take 1 tablet (500 mg total) by mouth 2 (two) times daily with a meal. 03/02/18   Tommi Rumps, PA-C    Allergies Patient has no known allergies.  No family history on file.  Social History Social History   Tobacco Use  . Smoking status: Current Every Day Smoker    Packs/day: 1.00    Types: Cigarettes  Substance Use Topics  . Alcohol use: Yes  . Drug use: No    Review of Systems Constitutional: No fever/chills Cardiovascular: Denies chest pain. Respiratory: Denies shortness of breath. Musculoskeletal: Negative for back pain. Skin: Positive for rash. Neurological: Negative for headaches, focal weakness or  numbness. ____________________________________________   PHYSICAL EXAM:  VITAL SIGNS: ED Triage Vitals  Enc Vitals Group     BP 03/02/18 0730 127/75     Pulse Rate 03/02/18 0730 63     Resp 03/02/18 0730 20     Temp 03/02/18 0730 98.1 F (36.7 C)     Temp Source 03/02/18 0730 Oral     SpO2 03/02/18 0730 100 %     Weight 03/02/18 0731 170 lb (77.1 kg)     Height 03/02/18 0731  (1.778 m)     Head Circumference --      Peak Flow --      Pain Score 03/02/18 0730 2     Pain Loc --      Pain Edu? --      Excl. in GC? --     Constitutional: Alert and oriented. Well appearing and in no acute distress. Eyes: Conjunctivae are normal.  Head: Atraumatic. Neck: No stridor.   Hematological/Lymphatic/Immunilogical: Positive left anterior lateral cervical lymphadenopathy. Cardiovascular: Normal rate, regular rhythm. Grossly normal heart sounds.  Good peripheral circulation. Respiratory: Normal respiratory effort.  No retractions. Lungs CTAB. Musculoskeletal: Moves upper and lower extremities without any difficulty.  Normal gait was noted.  No joint swelling is appreciated. Neurologic:  Normal speech and language. No gross focal neurologic deficits are appreciated.  Skin:  Skin is warm, dry.  Patient has a single erythematous patch to his left parascapular area that is in clusters.  There are no blister formations but this appears to have healed with individual papules still present.  Area is tender to touch.  There are no other rashes noted to his torso. Psychiatric: Mood and affect are normal. Speech and behavior are normal.  ____________________________________________   LABS (all labs ordered are listed, but only abnormal results are displayed)  Labs Reviewed - No data to display  PROCEDURES  Procedure(s) performed: None  Procedures  Critical Care performed: No  ____________________________________________   INITIAL IMPRESSION / ASSESSMENT AND PLAN / ED  COURSE  Patient was made aware that this looks very suspicious for herpes zoster.  Because this began last week we discussed antivirals not being prescribed today.  Patient was given prescription for Norco 1 every 6 hours as needed for pain.  Naproxen 500 mg twice daily with food.  He is to continue watching this area and keep it clean and dry.  He is to follow-up with 1 the clinics listed on his discharge papers.  We also discussed establishing primary care.  ____________________________________________   FINAL CLINICAL IMPRESSION(S) / ED DIAGNOSES  Final diagnoses:  Herpes zoster without complication     ED Discharge Orders        Ordered    HYDROcodone-acetaminophen (NORCO) 5-325 MG tablet  Every 6 hours PRN     03/02/18 0901    naproxen (NAPROSYN) 500 MG tablet  2 times daily with meals     03/02/18 0901       Note:  This document was prepared using Dragon voice recognition software and may include unintentional dictation errors.    Tommi Rumps, PA-C 03/02/18 1220    Minna Antis, MD 03/02/18 1358

## 2018-03-02 NOTE — ED Triage Notes (Signed)
Pt states that he thinks he has Lymes disease again. Pt reports rash on his shoulder, knot on his neck and bodyaches.

## 2018-03-04 ENCOUNTER — Emergency Department
Admission: EM | Admit: 2018-03-04 | Discharge: 2018-03-04 | Disposition: A | Discharge status: Home / Self Care | Payer: Self-pay | Attending: Emergency Medicine | Admitting: Emergency Medicine

## 2018-03-04 ENCOUNTER — Encounter: Payer: Self-pay | Admitting: Emergency Medicine

## 2018-03-04 ENCOUNTER — Other Ambulatory Visit: Payer: Self-pay

## 2018-03-04 DIAGNOSIS — R112 Nausea with vomiting, unspecified: Secondary | ICD-10-CM | POA: Insufficient documentation

## 2018-03-04 DIAGNOSIS — M549 Dorsalgia, unspecified: Secondary | ICD-10-CM

## 2018-03-04 DIAGNOSIS — F17210 Nicotine dependence, cigarettes, uncomplicated: Secondary | ICD-10-CM | POA: Insufficient documentation

## 2018-03-04 DIAGNOSIS — M545 Low back pain: Secondary | ICD-10-CM | POA: Insufficient documentation

## 2018-03-04 DIAGNOSIS — K529 Noninfective gastroenteritis and colitis, unspecified: Secondary | ICD-10-CM | POA: Insufficient documentation

## 2018-03-04 DIAGNOSIS — R197 Diarrhea, unspecified: Secondary | ICD-10-CM | POA: Insufficient documentation

## 2018-03-04 LAB — COMPREHENSIVE METABOLIC PANEL
ALBUMIN: 6.1 g/dL — AB (ref 3.5–5.0)
ALK PHOS: 101 U/L (ref 38–126)
ALT: 22 U/L (ref 17–63)
ANION GAP: 13 (ref 5–15)
AST: 30 U/L (ref 15–41)
BILIRUBIN TOTAL: 0.9 mg/dL (ref 0.3–1.2)
BUN: 32 mg/dL — AB (ref 6–20)
CALCIUM: 10.3 mg/dL (ref 8.9–10.3)
CO2: 26 mmol/L (ref 22–32)
Chloride: 96 mmol/L — ABNORMAL LOW (ref 101–111)
Creatinine, Ser: 1.31 mg/dL — ABNORMAL HIGH (ref 0.61–1.24)
GFR calc Af Amer: >60 mL/min (ref >60)
GFR calc non Af Amer: >60 mL/min (ref >60)
GLUCOSE: 124 mg/dL — AB (ref 65–99)
Potassium: 4.2 mmol/L (ref 3.5–5.1)
SODIUM: 135 mmol/L (ref 135–145)
TOTAL PROTEIN: 9.6 g/dL — AB (ref 6.5–8.1)

## 2018-03-04 LAB — URINALYSIS, COMPLETE (UACMP) WITH MICROSCOPIC
Bilirubin Urine: NEGATIVE
GLUCOSE, UA: NEGATIVE mg/dL
Hgb urine dipstick: NEGATIVE
Ketones, ur: 5 mg/dL — AB
Leukocytes, UA: NEGATIVE
NITRITE: NEGATIVE
PROTEIN: NEGATIVE mg/dL
SPECIFIC GRAVITY, URINE: 1.025 (ref 1.005–1.030)
pH: 5 (ref 5.0–8.0)

## 2018-03-04 LAB — CBC
HCT: 50.8 % (ref 40.0–52.0)
HEMOGLOBIN: 17.6 g/dL (ref 13.0–18.0)
MCH: 30.4 pg (ref 26.0–34.0)
MCHC: 34.7 g/dL (ref 32.0–36.0)
MCV: 87.8 fL (ref 80.0–100.0)
Platelets: 205 10*3/uL (ref 150–440)
RBC: 5.79 MIL/uL (ref 4.40–5.90)
RDW: 12.9 % (ref 11.5–14.5)
WBC: 9.9 10*3/uL (ref 3.8–10.6)

## 2018-03-04 LAB — LIPASE, BLOOD: Lipase: 23 U/L (ref 11–51)

## 2018-03-04 MED ORDER — SODIUM CHLORIDE 0.9 % IV BOLUS
1000.0000 mL | Freq: Once | INTRAVENOUS | Status: AC
  Administered 2018-03-04: 1000 mL via INTRAVENOUS

## 2018-03-04 MED ORDER — PREDNISONE 10 MG (21) PO TBPK: ORAL_TABLET | ORAL | 0 refills | Status: DC

## 2018-03-04 MED ORDER — DICYCLOMINE HCL 10 MG PO CAPS
10.0000 mg | ORAL_CAPSULE | Freq: Once | ORAL | Status: AC
  Administered 2018-03-04: 10 mg via ORAL

## 2018-03-04 MED ORDER — ONDANSETRON HCL 4 MG/2ML IJ SOLN
INTRAMUSCULAR | Status: AC
  Administered 2018-03-04: 4 mg via INTRAVENOUS
  Filled 2018-03-04: qty 2

## 2018-03-04 MED ORDER — DICYCLOMINE HCL 10 MG PO CAPS
ORAL_CAPSULE | ORAL | Status: AC
  Administered 2018-03-04: 10 mg via ORAL
  Filled 2018-03-04: qty 1

## 2018-03-04 MED ORDER — DICYCLOMINE HCL 20 MG PO TABS: 20.0000 mg | ORAL_TABLET | Freq: Three times a day (TID) | ORAL | 0 refills | Status: DC | PRN

## 2018-03-04 MED ORDER — KETOROLAC TROMETHAMINE 30 MG/ML IJ SOLN
30.0000 mg | Freq: Once | INTRAMUSCULAR | Status: AC
  Administered 2018-03-04: 30 mg via INTRAVENOUS
  Filled 2018-03-04: qty 1

## 2018-03-04 MED ORDER — ONDANSETRON HCL 4 MG PO TABS: 4.0000 mg | ORAL_TABLET | Freq: Three times a day (TID) | ORAL | 0 refills | Status: DC | PRN

## 2018-03-04 MED ORDER — ONDANSETRON HCL 4 MG/2ML IJ SOLN
4.0000 mg | Freq: Once | INTRAMUSCULAR | Status: AC
  Administered 2018-03-04: 4 mg via INTRAVENOUS

## 2018-03-04 NOTE — ED Notes (Addendum)
Patient is resting comfortably. 

## 2018-03-04 NOTE — ED Notes (Signed)
Pt in reporting 8/10 throbbing, sharp lower back pain that shoots down his legs causing his legs to feel numb that began this morning. Has not taken anything for pain because he vomits with any PO intake. Pt also reports having a "stomach bug" that began 2 days ago.

## 2018-03-04 NOTE — Discharge Instructions (Addendum)
Please seek medical attention for any high fevers, chest pain, shortness of breath, change in behavior, persistent vomiting, bloody stool or any other new or concerning symptoms.  

## 2018-03-04 NOTE — ED Provider Notes (Signed)
[facility] Emergency Department Provider Note  ____________________________________________   I have reviewed the triage vital signs and the nursing notes.   HISTORY  Chief Complaint Back Pain and Abdominal Pain   History limited by: Not Limited   HPI Andrew Brewer is a 27 y.o. male who presents to the emergency department today with primary concerns for abdominal pain nausea vomiting and diarrhea.  He states his symptoms started this morning after he got home from work.  He states he has multiple episodes of vomiting and diarrhea.  This has been accompanied by generalized abdominal cramping.  Multiple members of his household have had similar symptoms.  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  Patient states he has chronic low back pain but after today's become severe.  He does have some radiation down his legs.  Per medical record review patient has a history of ER visit two days ago and diagnosed with herpes zoster.   History reviewed. No pertinent past medical history.  There are no active problems to display for this patient.   History reviewed. No pertinent surgical history.  Prior to Admission medications   Medication Sig Start Date End Date Taking? Authorizing Provider  HYDROcodone-acetaminophen (NORCO) 5-325 MG tablet Take 1 tablet by mouth every 6 (six) hours as needed for moderate pain. 03/02/18   Tommi Rumps, PA-C  naproxen (NAPROSYN) 500 MG tablet Take 1 tablet (500 mg total) by mouth 2 (two) times daily with a meal. 03/02/18   Tommi Rumps, PA-C    Allergies Patient has no known allergies.  No family history on file.  Social History Social History   Tobacco Use  . Smoking status: Current Every Day Smoker    Packs/day: 1.00    Types: Cigarettes  . Smokeless tobacco: Never Used  Substance Use Topics  . Alcohol use: Yes  . Drug use: No    Review of Systems Constitutional: No  fever/chills Eyes: No visual changes. ENT: No sore throat. Cardiovascular: Denies chest pain. Respiratory: Denies shortness of breath. Gastrointestinal: Positive abdominal pain nausea vomiting and diarrhea Genitourinary: Negative for dysuria. Musculoskeletal: Positive for back pain Skin: Positive for rash to left back. Neurological: Negative for headaches, focal weakness or numbness.  ____________________________________________   PHYSICAL EXAM:  VITAL SIGNS: ED Triage Vitals  Enc Vitals Group     BP 03/04/18 2052 119/78     Pulse Rate 03/04/18 2052 (!) 128     Resp 03/04/18 2052 20     Temp 03/04/18 2052 98.4 F (36.9 C)     Temp Source 03/04/18 2052 Oral     SpO2 03/04/18 2052 100 %     Weight 03/04/18 2051 170 lb (77.1 kg)     Height 03/04/18 2051  (1.778 m)     Head Circumference --      Peak Flow --      Pain Score 03/04/18 2051 8   Constitutional: Alert and oriented. Well appearing and in no distress. Eyes: Conjunctivae are normal.  ENT   Head: Normocephalic and atraumatic.   Nose: No congestion/rhinnorhea.   Mouth/Throat: Mucous membranes are moist.   Neck: No stridor. Hematological/Lymphatic/Immunilogical: No cervical lymphadenopathy. Cardiovascular: Tachycardic regular rhythm.  No murmurs, rubs, or gallops.  Respiratory: Normal respiratory effort without tachypnea nor retractions. Breath sounds are clear and equal bilaterally. No wheezes/rales/rhonchi. Gastrointestinal: Soft and diffusely tender to palpation. No rebound. No guarding.  Genitourinary: Deferred Musculoskeletal:  Normal range of motion in all extremities. No lower extremity edema. Neurologic:  Normal speech and language. No gross focal neurologic deficits are appreciated.  Skin:  Skin is warm, dry and intact. No rash noted. Psychiatric: Mood and affect are normal. Speech and behavior are normal. Patient exhibits appropriate insight and  judgment.  ____________________________________________    LABS (pertinent positives/negatives)  UA hazy, wbc 6-10, bacteria rare WBC wnl Lipase 23 CMP glu 124, cr 1.31 ____________________________________________   EKG  None  ____________________________________________    RADIOLOGY  None  ____________________________________________   PROCEDURES  Procedures  ____________________________________________   INITIAL IMPRESSION / ASSESSMENT AND PLAN / ED COURSE  Pertinent labs & imaging results that were available during my care of the patient were reviewed by me and considered in my medical decision making (see chart for details).  Patient presented to the emergency department today with primary concern for nausea vomiting and abdominal cramping.  Multiple members of the hospital have had similar symptoms think gastroenteritis likely.  No leukocytosis or fever here to suggest appendicitis.  Additionally he is having some acute on chronic back pain.  This is probably exacerbated by vomiting.  Patient was given IV fluids medication here in the emergency department with improvement of his symptoms.  Will plan on discharging with medication for the nausea and abdominal cramping as well as steroid taper for back pain.  Discussed importance of primary care follow-up.   ____________________________________________   FINAL CLINICAL IMPRESSION(S) / ED DIAGNOSES  Final diagnoses:  Gastroenteritis  Back pain, unspecified back location, unspecified back pain laterality, unspecified chronicity     Note: This dictation was prepared with Dragon dictation. Any transcriptional errors that result from this process are unintentional     Phineas Semen, MD 03/04/18 2252

## 2018-03-04 NOTE — ED Triage Notes (Addendum)
Pt to triage via w/c with no distress noted; pt c/o lower back pain radiating down legs today; st hx of same; denies any recent injury; pt also reports generalized abd pain with N/V/D today

## 2018-06-20 ENCOUNTER — Other Ambulatory Visit: Payer: Self-pay

## 2018-06-20 ENCOUNTER — Emergency Department
Admission: EM | Admit: 2018-06-20 | Discharge: 2018-06-20 | Disposition: A | Discharge status: Home / Self Care | Payer: Self-pay | Attending: Emergency Medicine | Admitting: Emergency Medicine

## 2018-06-20 ENCOUNTER — Encounter: Payer: Self-pay | Admitting: Emergency Medicine

## 2018-06-20 DIAGNOSIS — Y929 Unspecified place or not applicable: Secondary | ICD-10-CM | POA: Insufficient documentation

## 2018-06-20 DIAGNOSIS — L2489 Irritant contact dermatitis due to other agents: Secondary | ICD-10-CM | POA: Insufficient documentation

## 2018-06-20 DIAGNOSIS — X500XXA Overexertion from strenuous movement or load, initial encounter: Secondary | ICD-10-CM | POA: Insufficient documentation

## 2018-06-20 DIAGNOSIS — F17210 Nicotine dependence, cigarettes, uncomplicated: Secondary | ICD-10-CM | POA: Insufficient documentation

## 2018-06-20 DIAGNOSIS — Y998 Other external cause status: Secondary | ICD-10-CM | POA: Insufficient documentation

## 2018-06-20 DIAGNOSIS — Y9389 Activity, other specified: Secondary | ICD-10-CM | POA: Insufficient documentation

## 2018-06-20 DIAGNOSIS — Z79899 Other long term (current) drug therapy: Secondary | ICD-10-CM | POA: Insufficient documentation

## 2018-06-20 DIAGNOSIS — S63502A Unspecified sprain of left wrist, initial encounter: Secondary | ICD-10-CM | POA: Insufficient documentation

## 2018-06-20 MED ORDER — IBUPROFEN 600 MG PO TABS: 600.0000 mg | ORAL_TABLET | Freq: Four times a day (QID) | ORAL | 0 refills | Status: DC | PRN

## 2018-06-20 MED ORDER — DEXAMETHASONE SODIUM PHOSPHATE 10 MG/ML IJ SOLN
10.0000 mg | Freq: Once | INTRAMUSCULAR | Status: AC
  Administered 2018-06-20: 10 mg via INTRAMUSCULAR
  Filled 2018-06-20: qty 1

## 2018-06-20 MED ORDER — PREDNISONE 10 MG PO TABS: 10.0000 mg | ORAL_TABLET | Freq: Every day | ORAL | 0 refills | Status: DC

## 2018-06-20 NOTE — Discharge Instructions (Addendum)
Please take prednisone as prescribed.  Use Velcro wrist brace as needed for left wrist pain.  Please wear at all times except for showering for 6 weeks.  Use Benadryl as needed for additional itching.  Avoid taking hot showers to prevent spreading of rash.

## 2018-06-20 NOTE — ED Provider Notes (Signed)
[facility] EMERGENCY DEPARTMENT Provider Note   CSN: [id number] Arrival date & time: 06/20/18  1218     History   Chief Complaint Chief Complaint  Patient presents with  . Wrist Pain  . Rash    HPI Andrew Brewer is a 27 y.o. male presents to the emergency department for evaluation of 2 months of left wrist pain.  Patient points to the ulnar aspect of the left wrist states he has pain with ulnar deviation.  He states he was lifting a heavy log 2 months ago and felt pain.  He describes a sharp pain and snapping with rotation of his wrist.  He has not tried any anti-inflammatory medications of bracing.  Pain is mild to moderate.  He continues to work performing a lot of repetitive heavy lifting.  Is also here today for a rash to both upper extremities, right leg and his face.  He states he was in poison ivy recently and has had 3 days of a erythematous itchy rash.  He denies any eye pain, drainage.  No fevers.  HPI  History reviewed. No pertinent past medical history.  There are no active problems to display for this patient.   History reviewed. No pertinent surgical history.      Home Medications    Prior to Admission medications   Medication Sig Start Date End Date Taking? Authorizing Provider  dicyclomine (BENTYL) 20 MG tablet Take 1 tablet (20 mg total) by mouth 3 (three) times daily as needed (abdominal pain). 03/04/18   Phineas SemenGoodman, Graydon, MD  HYDROcodone-acetaminophen (NORCO) 5-325 MG tablet Take 1 tablet by mouth every 6 (six) hours as needed for moderate pain. 03/02/18   Tommi RumpsSummers, Rhonda L, PA-C  ibuprofen (ADVIL,MOTRIN) 600 MG tablet Take 1 tablet (600 mg total) by mouth every 6 (six) hours as needed for moderate pain. 06/20/18   Evon SlackGaines, Beryle Bagsby C, PA-C  naproxen (NAPROSYN) 500 MG tablet Take 1 tablet (500 mg total) by mouth 2 (two) times daily with a meal. 03/02/18   Bridget HartshornSummers, Rhonda L, PA-C  ondansetron (ZOFRAN) 4 MG tablet Take 1 tablet (4 mg total) by  mouth every 8 (eight) hours as needed for nausea or vomiting. 03/04/18   Phineas SemenGoodman, Graydon, MD  predniSONE (DELTASONE) 10 MG tablet Take 1 tablet (10 mg total) by mouth daily. 6,5,4,3,2,1 six day taper 06/20/18   Evon SlackGaines, Sharleen Szczesny C, PA-C    Family History No family history on file.  Social History Social History   Tobacco Use  . Smoking status: Current Every Day Smoker    Packs/day: 1.00    Types: Cigarettes  . Smokeless tobacco: Never Used  Substance Use Topics  . Alcohol use: Yes  . Drug use: No     Allergies   Patient has no known allergies.   Review of Systems Review of Systems  Constitutional: Negative for fever.  Musculoskeletal: Positive for arthralgias. Negative for back pain, gait problem, neck pain and neck stiffness.  Skin: Positive for rash. Negative for wound.  Neurological: Negative for weakness and numbness.     Physical Exam Updated Vital Signs BP (!) 133/57 (BP Location: Left Arm)   Pulse 63   Temp 98.4 F (36.9 C) (Oral)   Resp 16   Wt 77.1 kg   SpO2 100%   BMI 24.39 kg/m   Physical Exam  Constitutional: He is oriented to person, place, and time. He appears well-developed and well-nourished.  HENT:  Head: Normocephalic and atraumatic.  Mouth/Throat: No oropharyngeal  exudate.  Eyes: Conjunctivae are normal.  Neck: Normal range of motion.  Cardiovascular: Normal rate.  Pulmonary/Chest: Effort normal. No respiratory distress.  Musculoskeletal: Normal range of motion.  Left wrist with normal range of motion.  He is tender slightly along the TFCC region of the left wrist with no limited range of motion.  No snapping or popping noted with pronation, supination.  Pain mostly only with ulnar deviation.  No bony tenderness to palpation.  He is nervous intact in left upper extremity.  Patient has maculopapular rash along the face, right cheek, both upper extremities on the forearms and hands along his right anterior thigh.  Rash consistent with contact  dermatitis  Neurological: He is alert and oriented to person, place, and time.  Skin: Skin is warm. No rash noted.  Psychiatric: He has a normal mood and affect. His behavior is normal. Thought content normal.     ED Treatments / Results  Labs (all labs ordered are listed, but only abnormal results are displayed) Labs Reviewed - No data to display  EKG None  Radiology No results found.  Procedures .Splint Application Date/Time: 06/20/2018 12:46 PM Performed by: Evon Slack, PA-C Authorized by: Evon Slack, PA-C   Consent:    Consent obtained:  Verbal   Consent given by:  Patient Pre-procedure details:    Sensation:  Normal Procedure details:    Laterality:  Left   Location:  Wrist   Cast type:  Short arm   Supplies:  Prefabricated splint Post-procedure details:    Pain:  Unchanged   Sensation:  Normal   Patient tolerance of procedure:  Tolerated well, no immediate complications   (including critical care time)  Medications Ordered in ED Medications  dexamethasone (DECADRON) injection 10 mg (10 mg Intramuscular Given 06/20/18 1239)     Initial Impression / Assessment and Plan / ED Course  I have reviewed the triage vital signs and the nursing notes.  Pertinent labs & imaging results that were available during my care of the patient were reviewed by me and considered in my medical decision making (see chart for details).     27 year old male with left wrist sprain x8 weeks.  He is placed into a Velcro wrist brace.  Patient given 6-day steroid taper to help with contact dermatitis consistent with poison ivy.  He will continue with Benadryl.  After completion of steroid taper patient will use ibuprofen as needed.  Patient will follow-up PCP orthopedics if no improvement with a Velcro wrist brace for 4 weeks.  Final Clinical Impressions(s) / ED Diagnoses   Final diagnoses:  Irritant contact dermatitis due to other agents  Left wrist sprain, initial  encounter    ED Discharge Orders         Ordered    ibuprofen (ADVIL,MOTRIN) 600 MG tablet  Every 6 hours PRN     06/20/18 1237    predniSONE (DELTASONE) 10 MG tablet  Daily     06/20/18 1237           Ronnette Juniper 06/20/18 1248    Jeanmarie Plant, MD 06/20/18 1402

## 2018-06-20 NOTE — ED Notes (Signed)
See triage note    Presents with pain to left wrist for about 1-2 months   Developed pain when lifting something  conts to have "popping in wrist with movement  Also presents with poison ivy to face and arms

## 2018-06-20 NOTE — ED Triage Notes (Signed)
Pt to ED via POV c/o left wrist pain x 2 months and also of poison oak that is spreading. Pt is in NAD at this time.

## 2018-09-23 ENCOUNTER — Encounter: Payer: Self-pay | Admitting: Emergency Medicine

## 2018-09-23 ENCOUNTER — Emergency Department: Payer: Self-pay

## 2018-09-23 ENCOUNTER — Emergency Department
Admission: EM | Admit: 2018-09-23 | Discharge: 2018-09-23 | Disposition: A | Discharge status: Home / Self Care | Payer: Self-pay | Attending: Emergency Medicine | Admitting: Emergency Medicine

## 2018-09-23 ENCOUNTER — Other Ambulatory Visit: Payer: Self-pay

## 2018-09-23 DIAGNOSIS — R1084 Generalized abdominal pain: Secondary | ICD-10-CM | POA: Insufficient documentation

## 2018-09-23 DIAGNOSIS — F17210 Nicotine dependence, cigarettes, uncomplicated: Secondary | ICD-10-CM | POA: Insufficient documentation

## 2018-09-23 DIAGNOSIS — R109 Unspecified abdominal pain: Secondary | ICD-10-CM

## 2018-09-23 LAB — CBC WITH DIFFERENTIAL/PLATELET
ABS IMMATURE GRANULOCYTES: 0.01 10*3/uL (ref 0.00–0.07)
Basophils Absolute: 0 10*3/uL (ref 0.0–0.1)
Basophils Relative: 0 %
Eosinophils Absolute: 0.4 10*3/uL (ref 0.0–0.5)
Eosinophils Relative: 6 %
HCT: 43.5 % (ref 39.0–52.0)
HEMOGLOBIN: 14.6 g/dL (ref 13.0–17.0)
Immature Granulocytes: 0 %
LYMPHS PCT: 46 %
Lymphs Abs: 2.9 10*3/uL (ref 0.7–4.0)
MCH: 30 pg (ref 26.0–34.0)
MCHC: 33.6 g/dL (ref 30.0–36.0)
MCV: 89.5 fL (ref 80.0–100.0)
MONO ABS: 0.5 10*3/uL (ref 0.1–1.0)
MONOS PCT: 8 %
NEUTROS ABS: 2.5 10*3/uL (ref 1.7–7.7)
Neutrophils Relative %: 40 %
Platelets: 165 10*3/uL (ref 150–400)
RBC: 4.86 MIL/uL (ref 4.22–5.81)
RDW: 11.8 % (ref 11.5–15.5)
WBC: 6.2 10*3/uL (ref 4.0–10.5)
nRBC: 0 % (ref 0.0–0.2)

## 2018-09-23 LAB — COMPREHENSIVE METABOLIC PANEL
ALK PHOS: 72 U/L (ref 38–126)
ALT: 14 U/L (ref 0–44)
AST: 19 U/L (ref 15–41)
Albumin: 4.6 g/dL (ref 3.5–5.0)
Anion gap: 8 (ref 5–15)
BUN: 14 mg/dL (ref 6–20)
CALCIUM: 9.5 mg/dL (ref 8.9–10.3)
CO2: 29 mmol/L (ref 22–32)
CREATININE: 1 mg/dL (ref 0.61–1.24)
Chloride: 102 mmol/L (ref 98–111)
Glucose, Bld: 115 mg/dL — ABNORMAL HIGH (ref 70–99)
Potassium: 3.5 mmol/L (ref 3.5–5.1)
SODIUM: 139 mmol/L (ref 135–145)
Total Bilirubin: 0.6 mg/dL (ref 0.3–1.2)
Total Protein: 7.2 g/dL (ref 6.5–8.1)

## 2018-09-23 LAB — URINALYSIS, COMPLETE (UACMP) WITH MICROSCOPIC
BACTERIA UA: NONE SEEN
BILIRUBIN URINE: NEGATIVE
Glucose, UA: NEGATIVE mg/dL
Hgb urine dipstick: NEGATIVE
Ketones, ur: NEGATIVE mg/dL
Leukocytes, UA: NEGATIVE
NITRITE: NEGATIVE
Protein, ur: NEGATIVE mg/dL
SPECIFIC GRAVITY, URINE: 1.009 (ref 1.005–1.030)
Squamous Epithelial / LPF: NONE SEEN (ref 0–5)
pH: 6 (ref 5.0–8.0)

## 2018-09-23 LAB — LIPASE, BLOOD: Lipase: 27 U/L (ref 11–51)

## 2018-09-23 MED ORDER — DICYCLOMINE HCL 20 MG PO TABS: 20.0000 mg | ORAL_TABLET | Freq: Three times a day (TID) | ORAL | 0 refills | Status: DC | PRN

## 2018-09-23 MED ORDER — IOPAMIDOL (ISOVUE-300) INJECTION 61%
100.0000 mL | Freq: Once | INTRAVENOUS | Status: AC | PRN
  Administered 2018-09-23: 100 mL via INTRAVENOUS

## 2018-09-23 MED ORDER — IOPAMIDOL (ISOVUE-300) INJECTION 61%
30.0000 mL | Freq: Once | INTRAVENOUS | Status: AC | PRN
  Administered 2018-09-23: 30 mL via ORAL

## 2018-09-23 MED ORDER — ONDANSETRON HCL 4 MG/2ML IJ SOLN
4.0000 mg | Freq: Once | INTRAMUSCULAR | Status: AC
Start: 2018-09-23 — End: 2018-09-23
  Administered 2018-09-23: 4 mg via INTRAVENOUS
  Filled 2018-09-23: qty 2

## 2018-09-23 MED ORDER — SODIUM CHLORIDE 0.9 % IV BOLUS
1000.0000 mL | Freq: Once | INTRAVENOUS | Status: AC
  Administered 2018-09-23: 1000 mL via INTRAVENOUS

## 2018-09-23 NOTE — ED Triage Notes (Signed)
Pt here with c/o intermittent RLQ pain that began 2 weeks ago, worsening after certain meals and some movement, appears in no distress at this time.

## 2018-09-23 NOTE — ED Provider Notes (Signed)
[facility] EMERGENCY [facility]PART[facility] Provider Note   CSN: [id number] Arrival date & time: 09/23/18  [id number]     History   Chief Complaint Chief Complaint  Patient presents with  . Abdominal Pain    HPI Andrew Brewer is a 27 y.o. male here presenting with right lower quadrant pain.  Patient states that he has intermittent right-sided abdominal pain for last 2 weeks.  It started as a dull crampy pain.  It is worse right after he eats.  States that for the last 4 days, the pain is constant and migrated to the right lower quadrant.  He states that it is associated with some nausea and occasional vomiting.  Denies any fevers or chills.  Patient took a friend's oxycodone last night due to severe pain.  Denies any previous abdominal surgeries.   The history is provided by the patient.    History reviewed. No pertinent past medical history.  There are no active problems to display for this patient.   History reviewed. No pertinent surgical history.      Home Medications    Prior to Admission medications   Medication Sig Start Date End Date Taking? Authorizing Provider  dicyclomine (BENTYL) 20 MG tablet Take 1 tablet (20 mg total) by mouth 3 (three) times daily as needed (abdominal pain). Patient not taking: Reported on 09/23/2018 03/04/18   Phineas SemenGoodman, Graydon, MD  HYDROcodone-acetaminophen [facility](NORCO) 5-325 MG tablet Take 1 tablet by mouth every 6 (six) hours as needed for moderate pain. Patient not taking: Reported on 09/23/2018 03/02/18   Tommi RumpsSummers, Rhonda L, PA-C  ibuprofen (ADVIL,MOTRIN) 600 MG tablet Take 1 tablet (600 mg total) by mouth every 6 (six) hours as needed for moderate pain. Patient not taking: Reported on 09/23/2018 06/20/18   Evon SlackGaines, Thomas C, PA-C  naproxen (NAPROSYN) 500 MG tablet Take 1 tablet (500 mg total) by mouth 2 (two) times daily with a meal. Patient not taking: Reported on 09/23/2018 03/02/18   Tommi RumpsSummers, Rhonda L, PA-C  ondansetron (ZOFRAN) 4 MG  tablet Take 1 tablet (4 mg total) by mouth every 8 (eight) hours as needed for nausea or vomiting. Patient not taking: Reported on 09/23/2018 03/04/18   Phineas SemenGoodman, Graydon, MD  predniSONE (DELTASONE) 10 MG tablet Take 1 tablet (10 mg total) by mouth daily. 6,5,4,3,2,1 six day taper Patient not taking: Reported on 09/23/2018 06/20/18   Evon SlackGaines, Thomas C, PA-C    Family History No family history on file.  Social History Social History   Tobacco Use  . Smoking status: Current Every Day Smoker    Packs/day: 1.00    Types: Cigarettes  . Smokeless tobacco: Never Used  Substance Use Topics  . Alcohol use: Yes  . Drug use: No     Allergies   Patient has no known allergies.   Review of Systems Review of Systems  Gastrointestinal: Positive for abdominal pain.  All other systems reviewed and are negative.    Physical Exam Updated Vital Signs BP 131/81   Pulse 67   Temp 98.3 F (36.8 C) (Oral)   Resp 19   Ht 5\' 10"  (1.778 m)   Wt 77.1 kg   SpO2 98%   BMI 24.39 kg/m   Physical Exam  Constitutional: He is oriented to person, place, and time.  Slightly uncomfortable   HENT:  Head: Normocephalic.  Mouth/Throat: Oropharynx is clear and moist.  Eyes: Pupils are equal, round, and reactive to light. EOM are normal.  Cardiovascular: Normal rate, regular rhythm and  normal heart sounds.  Pulmonary/Chest: Effort normal and breath sounds normal.  Abdominal: Normal appearance.  Mild RUQ and RLQ and periumbilical tenderness, neg murphy's   Neurological: He is alert and oriented to person, place, and time.  Skin: Skin is warm. Capillary refill takes less than 2 seconds.  Psychiatric: He has a normal mood and affect. His behavior is normal.  Nursing note and vitals reviewed.    ED Treatments / Results  Labs (all labs ordered are listed, but only abnormal results are displayed) Labs Reviewed  COMPREHENSIVE METABOLIC PANEL - Abnormal; Notable for the following components:      Result  Value   Glucose, Bld 115 (*)    All other components within normal limits  URINALYSIS, COMPLETE (UACMP) WITH MICROSCOPIC - Abnormal; Notable for the following components:   Color, Urine STRAW (*)    APPearance CLEAR (*)    All other components within normal limits  CBC WITH DIFFERENTIAL/PLATELET  LIPASE, BLOOD    EKG None  Radiology Ct Abdomen Pelvis W Contrast  Result Date: 09/23/2018 CLINICAL DATA:  Lower abdominal pain, primarily on the right EXAM: CT ABDOMEN AND PELVIS WITH CONTRAST TECHNIQUE: Multidetector CT imaging of the abdomen and pelvis was performed using the standard protocol following bolus administration of intravenous contrast. Oral contrast was also administered. CONTRAST:  ISOVUE-300 IOPAMIDOL (ISOVUE-300) INJECTION 61% COMPARISON:  None. FINDINGS: Lower chest: Lung bases are clear. Hepatobiliary: No focal liver lesions are appreciable. Gallbladder wall is not appreciably thickened. There is no biliary duct dilatation. Pancreas: No pancreatic mass or inflammatory focus. Spleen: No splenic lesions are evident. Adrenals/Urinary Tract: Adrenals bilaterally appear unremarkable. Kidneys bilaterally show no evident mass or hydronephrosis on either side. There is an extrarenal pelvis on each side, an anatomic variant. There is no appreciable renal or ureteral calculus on either side. Urinary bladder is midline with wall thickness within normal limits. Urinary bladder is diffusely distended. Stomach/Bowel: There is no appreciable bowel wall or mesenteric thickening. There is no evident bowel obstruction. No free air or portal venous air. Vascular/Lymphatic: There is no abdominal aortic aneurysm. No vascular lesions are evident. There is no appreciable adenopathy in the abdomen or pelvis. Reproductive: Prostate and seminal vesicles appear normal in size and contour. There is no evident pelvic mass. Other: Appendix appears normal. There is no abscess or ascites in the abdomen or  pelvis. Musculoskeletal: There are no blastic or lytic bone lesions. There is no intramuscular or abdominal wall lesion. IMPRESSION: 1. Urinary bladder appears somewhat distended without wall thickening. Significance of this finding uncertain. 2. No evident renal or ureteral calculus. No hydronephrosis on either side. 3. No evident bowel obstruction. No abscess in the abdomen or pelvis. Appendix appears normal. Comment: A cause for focal right lower quadrant pain has not been established with this study. Electronically Signed   By: Bretta Bang III M.D.   On: 09/23/2018 09:08    Procedures Procedures (including critical care time)  Medications Ordered in ED Medications  sodium chloride 0.9 % bolus 1,000 mL (0 mLs Intravenous Stopped 09/23/18 1006)  ondansetron (ZOFRAN) injection 4 mg (4 mg Intravenous Given 09/23/18 0809)  iopamidol (ISOVUE-300) 61 % injection 30 mL (30 mLs Oral Contrast Given 09/23/18 0741)  iopamidol (ISOVUE-300) 61 % injection 100 mL (100 mLs Intravenous Contrast Given 09/23/18 0849)     Initial Impression / Assessment and Plan / ED Course  I have reviewed the triage vital signs and the nursing notes.  Pertinent labs & imaging results that  were available during my care of the patient were reviewed by me and considered in my medical decision making (see chart for details).    FINCH COSTANZO is a 27 y.o. male here with R sided abdominal pain. Likely gastro vs biliary colic vs appendicitis. Will get labs, UA, CT ab/pel.   10:07 AM CT unremarkable, just distended bladder. Able to urinate and post void bladder scan showed 0 cc. UA nl. WBC nl. I think likely mild gastro. Will dc home with bentyl prn cramps.    Final Clinical Impressions(s) / ED Diagnoses   Final diagnoses:  None    ED Discharge Orders    None       Charlynne Pander, MD 09/23/18 1008

## 2018-09-23 NOTE — Discharge Instructions (Signed)
You have normal labs, urinalysis, and CT scan currently. You likely have some abdominal cramps  Stay hydrated. Take bentyl for cramps   See your doctor  Return to ER if you have worse abdominal pain, vomiting, fever

## 2018-09-23 NOTE — ED Notes (Signed)
Bladder scan revealed 416 ml. Pt instructed to empty bladder and then he would be rescanned. Pt output 400 mL.

## 2018-09-23 NOTE — ED Notes (Signed)
Bladder scan revealed 0ml. °

## 2018-10-29 ENCOUNTER — Encounter: Payer: Self-pay | Admitting: Emergency Medicine

## 2018-10-29 ENCOUNTER — Other Ambulatory Visit: Payer: Self-pay

## 2018-10-29 ENCOUNTER — Emergency Department
Admission: EM | Admit: 2018-10-29 | Discharge: 2018-10-30 | Disposition: A | Discharge status: Other Acute Inpatient Hospital | Payer: Self-pay | Attending: Emergency Medicine | Admitting: Emergency Medicine

## 2018-10-29 DIAGNOSIS — F17210 Nicotine dependence, cigarettes, uncomplicated: Secondary | ICD-10-CM | POA: Insufficient documentation

## 2018-10-29 DIAGNOSIS — F329 Major depressive disorder, single episode, unspecified: Secondary | ICD-10-CM | POA: Insufficient documentation

## 2018-10-29 DIAGNOSIS — F32A Depression, unspecified: Secondary | ICD-10-CM

## 2018-10-29 DIAGNOSIS — Z046 Encounter for general psychiatric examination, requested by authority: Secondary | ICD-10-CM | POA: Insufficient documentation

## 2018-10-29 DIAGNOSIS — R45851 Suicidal ideations: Secondary | ICD-10-CM | POA: Insufficient documentation

## 2018-10-29 LAB — ETHANOL: Alcohol, Ethyl (B): <10 mg/dL (ref <10)

## 2018-10-29 LAB — CBC
HCT: 44.1 % (ref 39.0–52.0)
Hemoglobin: 15.1 g/dL (ref 13.0–17.0)
MCH: 30.4 pg (ref 26.0–34.0)
MCHC: 34.2 g/dL (ref 30.0–36.0)
MCV: 88.9 fL (ref 80.0–100.0)
PLATELETS: 178 10*3/uL (ref 150–400)
RBC: 4.96 MIL/uL (ref 4.22–5.81)
RDW: 11.7 % (ref 11.5–15.5)
WBC: 6.3 10*3/uL (ref 4.0–10.5)
nRBC: 0 % (ref 0.0–0.2)

## 2018-10-29 LAB — COMPREHENSIVE METABOLIC PANEL
ALT: 10 U/L (ref 0–44)
AST: 19 U/L (ref 15–41)
Albumin: 5 g/dL (ref 3.5–5.0)
Alkaline Phosphatase: 84 U/L (ref 38–126)
Anion gap: 8 (ref 5–15)
BILIRUBIN TOTAL: 0.8 mg/dL (ref 0.3–1.2)
BUN: 15 mg/dL (ref 6–20)
CO2: 28 mmol/L (ref 22–32)
CREATININE: 0.96 mg/dL (ref 0.61–1.24)
Calcium: 9.3 mg/dL (ref 8.9–10.3)
Chloride: 103 mmol/L (ref 98–111)
GFR calc Af Amer: >60 mL/min (ref >60)
GFR calc non Af Amer: >60 mL/min (ref >60)
Glucose, Bld: 93 mg/dL (ref 70–99)
POTASSIUM: 3.8 mmol/L (ref 3.5–5.1)
Sodium: 139 mmol/L (ref 135–145)
TOTAL PROTEIN: 7.6 g/dL (ref 6.5–8.1)

## 2018-10-29 MED ORDER — NICOTINE 21 MG/24HR TD PT24
21.0000 mg | MEDICATED_PATCH | Freq: Every day | TRANSDERMAL | Status: DC
  Administered 2018-10-29 – 2018-10-30 (×2): 21 mg via TRANSDERMAL
  Filled 2018-10-29 (×2): qty 1

## 2018-10-29 NOTE — ED Provider Notes (Signed)
[facility] Emergency Department Provider Note  ____________________________________________   I have reviewed the triage vital signs and the nursing notes.   HISTORY  Chief Complaint Suicidal   History limited by: Not Limited   HPI Andrew Brewer is a 28 y.o. male who presents to the emergency department today brought in by friend because of concerns for suicidal ideation.  The patient states it is been a number of months that he has been feeling depressed.  He states that he has had difficulty with sleeping.  He has had change with appetite.  He states he does not do anything and does not take joy in day-to-day activities.  The patient denies any history of depression.  States that he is undergoing a lot of life stress.  Apparently told his friend to take his guns because he was concerned he might shoot himself.   Per medical record review patient has a history of visits to the ED for non medical reasons.   History reviewed. No pertinent past medical history.  There are no active problems to display for this patient.   History reviewed. No pertinent surgical history.  Prior to Admission medications   Medication Sig Start Date End Date Taking? Authorizing Provider  dicyclomine (BENTYL) 20 MG tablet Take 1 tablet (20 mg total) by mouth 3 (three) times daily as needed (abdominal pain). 09/23/18   Charlynne Pander, MD  HYDROcodone-acetaminophen (NORCO) 5-325 MG tablet Take 1 tablet by mouth every 6 (six) hours as needed for moderate pain. Patient not taking: Reported on 09/23/2018 03/02/18   Tommi Rumps, PA-C  ibuprofen (ADVIL,MOTRIN) 600 MG tablet Take 1 tablet (600 mg total) by mouth every 6 (six) hours as needed for moderate pain. Patient not taking: Reported on 09/23/2018 06/20/18   Evon Slack, PA-C  naproxen (NAPROSYN) 500 MG tablet Take 1 tablet (500 mg total) by mouth 2 (two) times daily with a meal. Patient not taking: Reported on  09/23/2018 03/02/18   Tommi Rumps, PA-C  ondansetron (ZOFRAN) 4 MG tablet Take 1 tablet (4 mg total) by mouth every 8 (eight) hours as needed for nausea or vomiting. Patient not taking: Reported on 09/23/2018 03/04/18   Phineas Semen, MD  predniSONE (DELTASONE) 10 MG tablet Take 1 tablet (10 mg total) by mouth daily. 6,5,4,3,2,1 six day taper Patient not taking: Reported on 09/23/2018 06/20/18   Evon Slack, PA-C    Allergies Patient has no known allergies.  History reviewed. No pertinent family history.  Social History Social History   Tobacco Use  . Smoking status: Current Every Day Smoker    Packs/day: 1.00    Types: Cigarettes  . Smokeless tobacco: Never Used  Substance Use Topics  . Alcohol use: Yes  . Drug use: No    Review of Systems Constitutional: No fever/chills Eyes: No visual changes. ENT: No sore throat. Cardiovascular: Denies chest pain. Respiratory: Denies shortness of breath. Gastrointestinal: No abdominal pain.  No nausea, no vomiting.  No diarrhea.   Genitourinary: Negative for dysuria. Musculoskeletal: Negative for back pain. Skin: Negative for rash. Neurological: Negative for headaches, focal weakness or numbness.  ____________________________________________   PHYSICAL EXAM:  VITAL SIGNS: ED Triage Vitals  Enc Vitals Group     BP 10/29/18 1941 137/88     Pulse Rate 10/29/18 1941 (!) 110     Resp 10/29/18 1941 18     Temp 10/29/18 1941 98.7 F (37.1 C)     Temp Source 10/29/18 1941  Oral     SpO2 10/29/18 1941 98 %     Weight 10/29/18 1948 170 lb (77.1 kg)     Height 10/29/18 1948 5\' 10"  (1.778 m)     Head Circumference --      Peak Flow --      Pain Score 10/29/18 1948 0   Constitutional: Alert and oriented.  Eyes: Conjunctivae are normal.  ENT      Head: Normocephalic and atraumatic.      Nose: No congestion/rhinnorhea.      Mouth/Throat: Mucous membranes are moist.      Neck: No  stridor. Hematological/Lymphatic/Immunilogical: No cervical lymphadenopathy. Cardiovascular: Normal rate, regular rhythm.  No murmurs, rubs, or gallops.  Respiratory: Normal respiratory effort without tachypnea nor retractions. Breath sounds are clear and equal bilaterally. No wheezes/rales/rhonchi. Gastrointestinal: Soft and non tender. No rebound. No guarding.  Genitourinary: Deferred Musculoskeletal: Normal range of motion in all extremities. No lower extremity edema. Neurologic:  Normal speech and language. No gross focal neurologic deficits are appreciated.  Skin:  Skin is warm, dry and intact. No rash noted. Psychiatric: Depressed, pacing around the room.  ____________________________________________    LABS (pertinent positives/negatives)  UDS negative CMP wnl CBC wbc 6.3, hgb 15.1, plt 178 Ethanol <10 ____________________________________________   EKG  None  ____________________________________________    RADIOLOGY  None  ____________________________________________   PROCEDURES  Procedures  ____________________________________________   INITIAL IMPRESSION / ASSESSMENT AND PLAN / ED COURSE  Pertinent labs & imaging results that were available during my care of the patient were reviewed by me and considered in my medical decision making (see chart for details).   Patient presented to the emergency department because of concern for depression and thoughts of self harm. On exam patient does appear depressed, pacing around the room. Given concern for suicidal ideation will place under IVC. Will have SOC evaluate.     ____________________________________________   FINAL CLINICAL IMPRESSION(S) / ED DIAGNOSES  Depression  Note: This dictation was prepared with Dragon dictation. Any transcriptional errors that result from this process are unintentional     Phineas Semen, MD 10/30/18 [id number]

## 2018-10-29 NOTE — ED Notes (Signed)
Pt. Transferred to BHU from ED to room 3 after screening for contraband. Report to include Situation, Background, Assessment and [facility] Pt. Oriented to unit including Q15 minute rounds as well as the security cameras for their protection. Patient is alert and oriented, warm and dry in no acute distress. Patient denies HI, and AVH. Patient states he has SI without plan. Pt. Encouraged to let me know if needs arise.

## 2018-10-29 NOTE — ED Triage Notes (Signed)
Pt ambulatory to rm 23. Wanded for safety by [employer]. Pt reports several weeks of feeling depressed and suicidal. Reports issues with child custody and up coming court dates as stressors. Reports has a plan to shoot himself but had a friend take his guns because "I would not want someone to find me". Pt reports no prior mental health issues.

## 2018-10-29 NOTE — ED Notes (Signed)
Snack and beverage given. 

## 2018-10-29 NOTE — ED Notes (Signed)
Pt. Talking to TTS. 

## 2018-10-29 NOTE — ED Notes (Signed)
Pt dressed out by this [employer]. Pt belongings include boots, socks, blue jeans, red underware, long sleeve t shirt and pull over sweat shirt. Cell phone and lighter. One elastic hair band and baseball cap.

## 2018-10-29 NOTE — ED Notes (Signed)
Hourly rounding reveals patient in day room. No complaints, stable, in no acute distress. Q15 minute rounds and monitoring via Security Cameras to continue. 

## 2018-10-30 ENCOUNTER — Inpatient Hospital Stay
Admission: AD | Admit: 2018-10-30 | Discharge: 2018-11-03 | DRG: 885 | Disposition: A | Discharge status: Home / Self Care | Payer: No Typology Code available for payment source | Attending: Psychiatry | Admitting: Psychiatry

## 2018-10-30 ENCOUNTER — Encounter: Payer: Self-pay | Admitting: *Deleted

## 2018-10-30 DIAGNOSIS — F17200 Nicotine dependence, unspecified, uncomplicated: Secondary | ICD-10-CM | POA: Diagnosis present

## 2018-10-30 DIAGNOSIS — F17210 Nicotine dependence, cigarettes, uncomplicated: Secondary | ICD-10-CM | POA: Diagnosis present

## 2018-10-30 DIAGNOSIS — R45851 Suicidal ideations: Secondary | ICD-10-CM | POA: Diagnosis present

## 2018-10-30 DIAGNOSIS — G4700 Insomnia, unspecified: Secondary | ICD-10-CM | POA: Diagnosis present

## 2018-10-30 DIAGNOSIS — F322 Major depressive disorder, single episode, severe without psychotic features: Principal | ICD-10-CM

## 2018-10-30 LAB — URINE DRUG SCREEN, QUALITATIVE (ARMC ONLY)
AMPHETAMINES, UR SCREEN: NOT DETECTED
BENZODIAZEPINE, UR SCRN: NOT DETECTED
Barbiturates, Ur Screen: NOT DETECTED
CANNABINOID 50 NG, UR ~~LOC~~: NOT DETECTED
Cocaine Metabolite,Ur ~~LOC~~: NOT DETECTED
MDMA (ECSTASY) UR SCREEN: NOT DETECTED
Methadone Scn, Ur: NOT DETECTED
Opiate, Ur Screen: NOT DETECTED
PHENCYCLIDINE (PCP) UR S: NOT DETECTED
TRICYCLIC, UR SCREEN: NOT DETECTED

## 2018-10-30 MED ORDER — MIRTAZAPINE 15 MG PO TABS
15.0000 mg | ORAL_TABLET | Freq: Every day | ORAL | Status: DC
  Administered 2018-10-30 – 2018-11-02 (×4): 15 mg via ORAL
  Filled 2018-10-30 (×4): qty 1

## 2018-10-30 MED ORDER — LORAZEPAM 2 MG PO TABS
2.0000 mg | ORAL_TABLET | Freq: Once | ORAL | Status: AC
  Administered 2018-10-30: 2 mg via ORAL
  Filled 2018-10-30: qty 1

## 2018-10-30 MED ORDER — HYDROXYZINE HCL 50 MG PO TABS: 50.0000 mg | ORAL_TABLET | Freq: Three times a day (TID) | ORAL | Status: DC | PRN

## 2018-10-30 MED ORDER — TRAZODONE HCL 100 MG PO TABS: 100.0000 mg | ORAL_TABLET | Freq: Every evening | ORAL | Status: DC | PRN

## 2018-10-30 MED ORDER — NICOTINE 21 MG/24HR TD PT24
21.0000 mg | MEDICATED_PATCH | Freq: Every day | TRANSDERMAL | Status: DC
  Administered 2018-10-31 – 2018-11-03 (×3): 21 mg via TRANSDERMAL
  Filled 2018-10-30 (×4): qty 1

## 2018-10-30 MED ORDER — PNEUMOCOCCAL VAC POLYVALENT 25 MCG/0.5ML IJ INJ
0.5000 mL | INJECTION | INTRAMUSCULAR | Status: DC
  Filled 2018-10-30 (×2): qty 0.5

## 2018-10-30 MED ORDER — INFLUENZA VAC SPLIT QUAD 0.5 ML IM SUSY
0.5000 mL | PREFILLED_SYRINGE | INTRAMUSCULAR | Status: DC
  Filled 2018-10-30: qty 0.5

## 2018-10-30 NOTE — ED Notes (Signed)
Hourly rounding reveals patient in room. No complaints, stable, in no acute distress. Q15 minute rounds and monitoring via Security Cameras to continue. 

## 2018-10-30 NOTE — ED Notes (Signed)
Pt given breakfast tray
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
## 2018-10-30 NOTE — Consult Note (Signed)
  Psychiatry:Case reviewed with TTS and chart reviewed. Patient is IVC and meets criteria for admission. Orders will be done.

## 2018-10-30 NOTE — ED Notes (Signed)
Pt given labeled specimen container and ask to provide urine. Pt stated that being here is just making things worse. The nicotine patch is not working and he wants a cigarette. Pt states he is ready to leave. This is just not helping. Pt informed that he is IVC and cannot leave until he see the doctor. Pt stated he isn't trying to be rude, he just doesn't want to be here because being in a room like this just makes things worse. This tech ask pt if he would like for me to talk to the RN about getting something to help him relax and pt stated no he doesn't want anything.
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
## 2018-10-30 NOTE — Progress Notes (Signed)
Recreation Therapy Notes  INPATIENT RECREATION THERAPY ASSESSMENT  Patient Details Name: Andrew Brewer MRN: [id number] DOB: 03/05/1991 Today's Date: 10/30/2018       Information Obtained From: Patient  Able to Participate in Assessment/Interview: Yes  Patient Presentation: Responsive  Reason for Admission (Per Patient): Active Symptoms, Other (Comments)(Depression)  Patient Stressors:    Coping Skills:   Exercise, Other (Comment)(Working on cars)  Leisure Interests (2+):  (Working on cars)  Frequency of Recreation/Participation: Pharmacist, community Resources:     [employer]:     Current Use:    If no, Barriers?:    Expressed Interest in [employer] Information:    [employer] of Residence:  Film/video editor  Patient Main Form of Transportation: Bicycle  Patient Strengths:  N/A  Patient Identified Areas of Improvement:  Get out of my slump  Patient Goal for Hospitalization:  Figure out how to deal with depression  Current SI (including self-harm):  No(not until night time)  Current HI:  No  Current AVH: No  Staff Intervention Plan: Group Attendance, Collaborate with Interdisciplinary Treatment Team  Consent to Intern Participation: N/A  Andrew Brewer 10/30/2018, 3:09 PM

## 2018-10-30 NOTE — BHH Group Notes (Signed)
LCSW Group Therapy Note  10/30/2018 1:00 PM  Type of Therapy and Topic:  Group Therapy:  Feelings around Relapse and Recovery  Participation Level:  Active   Description of Group:    Patients in this group will discuss emotions they experience before and after a relapse. They will process how experiencing these feelings, or avoidance of experiencing them, relates to having a relapse. Facilitator will guide patients to explore emotions they have related to recovery. Patients will be encouraged to process which emotions are more powerful. They will be guided to discuss the emotional reaction significant others in their lives may have to their relapse or recovery. Patients will be assisted in exploring ways to respond to the emotions of others without this contributing to a relapse.  Therapeutic Goals: 1. Patient will identify two or more emotions that lead to a relapse for them 2. Patient will identify two emotions that result when they relapse 3. Patient will identify two emotions related to recovery 4. Patient will demonstrate ability to communicate their needs through discussion and/or role plays   Summary of Patient Progress: Patient was an active and attentive participant in group.  Patient was able to identify both emotions before and after relapse as well as emotions that family/friends/supports can experience.  Patient was also able to identify feelings associated with recovery.  Patient was able to identify working on cars in his garage as an effective way to cope with negative feelings.    Therapeutic Modalities:   Cognitive Behavioral Therapy Solution-Focused Therapy Assertiveness Training Relapse Prevention Therapy   Penni Homans, MSW, LCSW 10/30/2018 2:23 PM
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
## 2018-10-30 NOTE — BHH Suicide Risk Assessment (Signed)
[facility] Admission Suicide Risk Assessment   Nursing information obtained from:  Patient Demographic factors:  Male, Caucasian, Unemployed, Divorced or widowed(Separated from wife) Current Mental Status:  NA("not right now") Loss Factors:  Decrease in vocational status, Loss of significant relationship, Legal issues, Financial problems / change in socioeconomic status("Separted from wife, no job, homeless") Historical Factors:  Family history of mental illness or substance abuse Risk Reduction Factors:  Sense of responsibility to family, Religious beliefs about death, Positive social support("My friend is supportive")  Total Time spent with patient: 1 hour Principal Problem: Severe major depression, single episode, without psychotic features (HCC) Diagnosis:  Principal Problem:   Severe major depression, single episode, without psychotic features (HCC) Active Problems:   Suicidal ideation  Subjective Data: Patient is reporting severely depressed mood, anxiety, hopelessness, constant negative thoughts about himself.  No energy or motivation.  Reports suicidal ideation with reports that he had recently thought about shooting himself.  Not reporting any clear psychotic symptoms.  Not abusing drugs or alcohol.  Not receiving any outpatient treatment.  Continued Clinical Symptoms:  Alcohol Use Disorder Identification Test Final Score (AUDIT): 7 The "Alcohol Use Disorders Identification Test", Guidelines for Use in Primary Care, Second Edition.  World Science writer [facility]). Score between 0-7:  no or low risk or alcohol related problems. Score between 8-15:  moderate risk of alcohol related problems. Score between 16-19:  high risk of alcohol related problems. Score 20 or above:  warrants further diagnostic evaluation for alcohol dependence and treatment.   CLINICAL FACTORS:   Depression:   Anhedonia Hopelessness Impulsivity   Musculoskeletal: Strength & Muscle Tone: within normal  limits Gait & Station: normal Patient leans: N/A  Psychiatric Specialty Exam: Physical Exam  Nursing note and vitals reviewed. Constitutional: He appears well-developed and well-nourished.  HENT:  Head: Normocephalic and atraumatic.  Eyes: Pupils are equal, round, and reactive to light. Conjunctivae are normal.  Neck: Normal range of motion.  Cardiovascular: Regular rhythm and normal heart sounds.  Respiratory: Effort normal. No respiratory distress.  GI: Soft.  Musculoskeletal: Normal range of motion.  Neurological: He is alert.  Skin: Skin is warm and dry.  Psychiatric: His speech is normal. His affect is blunt. His speech is not delayed. He is withdrawn. Cognition and memory are normal. He expresses impulsivity. He exhibits a depressed mood. He expresses suicidal ideation. He expresses suicidal plans.    Review of Systems  Constitutional: Negative.   HENT: Negative.   Eyes: Negative.   Respiratory: Negative.   Cardiovascular: Negative.   Gastrointestinal: Negative.   Musculoskeletal: Negative.   Skin: Negative.   Neurological: Negative.   Psychiatric/Behavioral: Positive for depression and suicidal ideas. Negative for hallucinations, memory loss and substance abuse. The patient is nervous/anxious and has insomnia.     Blood pressure 134/83, pulse 100, temperature 98.2 F (36.8 C), temperature source Oral, resp. rate 18, height 5\' 10"  (1.778 m), weight 77.1 kg, SpO2 100 %.Body mass index is 24.39 kg/m.  General Appearance: Casual  Eye Contact:  Good  Speech:  Clear and Coherent  Volume:  Decreased  Mood:  Anxious and Depressed  Affect:  Constricted  Thought Process:  Coherent  Orientation:  Full (Time, Place, and Person)  Thought Content:  Logical  Suicidal Thoughts:  Yes.  with intent/plan  Homicidal Thoughts:  No  Memory:  Immediate;   Fair Recent;   Fair Remote;   Fair  Judgement:  Fair  Insight:  Fair  Psychomotor Activity:  Normal  Concentration:  Concentration: Fair  Recall:  [employer]Fair  Fund of Knowledge:  Fair  Language:  Fair  Akathisia:  No  Handed:  Right  AIMS (if indicated):     Assets:  Desire for Improvement Physical Health Resilience  ADL's:  Intact  Cognition:  WNL  Sleep:         COGNITIVE FEATURES THAT CONTRIBUTE TO RISK:  Closed-mindedness    SUICIDE RISK:   Moderate:  Frequent suicidal ideation with limited intensity, and duration, some specificity in terms of plans, no associated intent, good self-control, limited dysphoria/symptomatology, some risk factors present, and identifiable protective factors, including available and accessible social support.  PLAN OF CARE: Patient admitted to the psychiatric unit.  15-minute checks in place.  Full assessment by treatment team.  Ongoing assessment of suicidality along with other symptoms.  Beginning antidepressant medicine for treatment.  Arrangement of appropriate outpatient care before discharge  I certify that inpatient services furnished can reasonably be expected to improve the patient's condition.   Andrew RasmussenJohn Mckenzey Parcell, MD 10/30/2018, 3:47 PM

## 2018-10-30 NOTE — BH Assessment (Addendum)
Patient is to be admitted to [facility] by Dr. Toni Amend..  Attending Physician will be Dr. Jennet Maduro.   Patient has been assigned to room 307, by [facility] Charge Nurse Veronique.   Intake Paper Work has been signed and placed on patient chart.  ER staff is aware of the admission:  LouAnn, ER Secretary    Dr. Darnelle Catalan , ER MD   Prudencio Burly, Patient's Nurse   Kasandra Knudsen, Patient Access.

## 2018-10-31 LAB — LIPID PANEL
Cholesterol: 177 mg/dL (ref 0–200)
HDL: 36 mg/dL — ABNORMAL LOW (ref >40)
LDL CALC: 108 mg/dL — AB (ref 0–99)
Total CHOL/HDL Ratio: 4.9 RATIO
Triglycerides: 163 mg/dL — ABNORMAL HIGH (ref <150)
VLDL: 33 mg/dL (ref 0–40)

## 2018-10-31 LAB — TSH: TSH: 0.937 u[IU]/mL (ref 0.350–4.500)

## 2018-10-31 MED ORDER — HYDROXYZINE HCL 50 MG PO TABS: 50.0000 mg | ORAL_TABLET | Freq: Three times a day (TID) | ORAL | Status: DC | PRN

## 2018-10-31 MED ORDER — ALUM & MAG HYDROXIDE-SIMETH 200-200-20 MG/5ML PO SUSP: 30.0000 mL | ORAL | Status: DC | PRN

## 2018-10-31 MED ORDER — TRAZODONE HCL 100 MG PO TABS: 100.0000 mg | ORAL_TABLET | Freq: Every evening | ORAL | Status: DC | PRN

## 2018-10-31 MED ORDER — ACETAMINOPHEN 325 MG PO TABS
650.0000 mg | ORAL_TABLET | Freq: Four times a day (QID) | ORAL | Status: DC | PRN
  Administered 2018-11-01 – 2018-11-03 (×3): 650 mg via ORAL
  Filled 2018-10-31 (×3): qty 2

## 2018-10-31 MED ORDER — MAGNESIUM HYDROXIDE 400 MG/5ML PO SUSP: 30.0000 mL | Freq: Every day | ORAL | Status: DC | PRN

## 2018-10-31 NOTE — Progress Notes (Signed)
[facility] MD Progress Note  10/31/2018 4:15 PM Andrew Brewer  MRN:  [id number] Subjective:   Identifying information: This is a 28 year old Caucasian male who is currently admitted to the inpatient psychiatric ward for depression and suicidal ideation.  Chart reviewed the patient has been compliant with his medications, working well with staff, no required medication for agitation.  No new lab work  The patient was seen today during rounds he was observed sitting in the milieu with his peers.  He reports that since he started mirtazapine he is able to sleep "better", he denies any change in his appetite at this time.  He denied any vivid dreams, or nightmares.  He reported that he is responding well to the milieu and feels "better" he denies current suicidality but reported feeling overwhelmed outside of the hospital.  No other complaints today.   Principal Problem: Severe major depression, single episode, without psychotic features (HCC) Diagnosis: Principal Problem:   Severe major depression, single episode, without psychotic features (HCC) Active Problems:   Suicidal ideation  Total Time spent with patient: 15 minutes  Past Psychiatric History: Reports remote suicidal behavior but no previous hospitalizations or medications  Past Medical History: History reviewed. No pertinent past medical history. History reviewed. No pertinent surgical history. Family History: History reviewed. No pertinent family history. Family Psychiatric  History: Denies Social History:  Social History   Substance and Sexual Activity  Alcohol Use Yes     Social History   Substance and Sexual Activity  Drug Use No    Social History   Socioeconomic History  . Marital status: Legally Separated    Spouse name: Not on file  . Number of children: Not on file  . Years of education: Not on file  . Highest education level: Not on file  Occupational History  . Not on file  Social Needs  . Financial  resource strain: Not on file  . Food insecurity:    Worry: Not on file    Inability: Not on file  . Transportation needs:    Medical: Not on file    Non-medical: Not on file  Tobacco Use  . Smoking status: Current Every Day Smoker    Packs/day: 1.00    Types: Cigarettes  . Smokeless tobacco: Never Used  Substance and Sexual Activity  . Alcohol use: Yes  . Drug use: No  . Sexual activity: Not on file  Lifestyle  . Physical activity:    Days per week: Not on file    Minutes per session: Not on file  . Stress: Not on file  Relationships  . Social connections:    Talks on phone: Not on file    Gets together: Not on file    Attends religious service: Not on file    Active member of club or organization: Not on file    Attends meetings of clubs or organizations: Not on file    Relationship status: Not on file  Other Topics Concern  . Not on file  Social History Narrative  . Not on file   Additional Social History:                         Sleep: Fair  Appetite:  Fair  Current Medications: Current Facility-Administered Medications  Medication Dose Route Frequency Provider Last Rate Last Dose  . hydrOXYzine (ATARAX/VISTARIL) tablet 50 mg  50 mg Oral TID PRN [name], [name], MD      .  Influenza vac split quadrivalent PF (FLUARIX) injection 0.5 mL  0.5 mL Intramuscular Tomorrow-1000 [name], [name], MD      . mirtazapine (REMERON) tablet 15 mg  15 mg Oral QHS [name], [name], MD   15 mg at 10/30/18 2153  . nicotine (NICODERM CQ - dosed in mg/24 hours) patch 21 mg  21 mg Transdermal Daily [name], [name], MD   21 mg at 10/31/18 0816  . pneumococcal 23 valent vaccine (PNU-IMMUNE) injection 0.5 mL  0.5 mL Intramuscular Tomorrow-1000 [name], [name], MD      . traZODone (DESYREL) tablet 100 mg  100 mg Oral QHS PRN [name], [name], MD        Lab Results:  Results for orders placed or performed during the hospital encounter of 10/30/18 (from the past 48 hour(s))  Lipid  panel     Status: Abnormal   Collection Time: 10/29/18  7:39 PM  Result Value Ref Range   Cholesterol 177 0 - 200 mg/dL   Triglycerides 340 (H) <150 mg/dL   HDL 36 (L) >35 mg/dL   Total CHOL/HDL Ratio 4.9 RATIO   VLDL 33 0 - 40 mg/dL   LDL Cholesterol 248 (H) 0 - 99 mg/dL    Comment:        Total Cholesterol/HDL:CHD Risk Coronary Heart Disease Risk Table                     Men   Women  1/2 Average Risk   3.4   3.3  Average Risk       5.0   4.4  2 X Average Risk   9.6   7.1  3 X Average Risk  23.4   11.0        Use the calculated Patient Ratio above and the CHD Risk Table to determine the patient's CHD Risk.        ATP III CLASSIFICATION (LDL):  <100     mg/dL   Optimal  185-909  mg/dL   Near or Above                    Optimal  130-159  mg/dL   Borderline  311-216  mg/dL   High  >244     mg/dL   Very High Performed at [facility], [address] [place], [place] [address]   TSH     Status: None   Collection Time: 10/29/18  7:39 PM  Result Value Ref Range   TSH 0.937 0.350 - 4.500 uIU/mL    Comment: Performed by a 3rd Generation assay with a functional sensitivity of <=0.01 uIU/mL. Performed at [facility], [address] [place], [place] [address]     Blood Alcohol level:  Lab Results  Component Value Date   [facility] <10 10/29/2018    Metabolic Disorder Labs: No results found for: HGBA1C, MPG No results found for: PROLACTIN Lab Results  Component Value Date   CHOL 177 10/29/2018   TRIG 163 (H) 10/29/2018   HDL 36 (L) 10/29/2018   CHOLHDL 4.9 10/29/2018   VLDL 33 10/29/2018   LDLCALC 108 (H) 10/29/2018    Physical Findings: AIMS: Facial and Oral Movements Muscles of Facial Expression: None, normal Lips and Perioral Area: None, normal Jaw: None, normal Tongue: None, normal,Extremity Movements Upper (arms, wrists, hands, fingers): None, normal Lower (legs, knees, ankles, toes): None, normal, Trunk Movements Neck, shoulders, hips:  None, normal, Overall Severity Severity of abnormal movements (  highest score from questions above): None, normal Incapacitation due to abnormal movements: None, normal Patient's awareness of abnormal movements (rate only patient's report): No Awareness, Dental Status Current problems with teeth and/or dentures?: No Does patient usually wear dentures?: No  CIWA:    COWS:     Musculoskeletal: Strength & Muscle Tone: within normal limits Gait & Station: normal Patient leans: N/A  Psychiatric Specialty Exam: Physical Exam  Constitutional: He appears well-developed and well-nourished.  HENT:  Head: Normocephalic and atraumatic.  Eyes: EOM are normal.  Respiratory: Effort normal.    Review of Systems  Constitutional: Negative.   Gastrointestinal: Negative for nausea.  Neurological: Negative for dizziness and headaches.    Blood pressure 116/67, pulse 60, temperature (!) 97.4 F (36.3 C), temperature source Oral, resp. rate 18, height 5\' 10"  (1.778 m), weight 77.1 kg, SpO2 100 %.Body mass index is 24.39 kg/m.  General Appearance: Casual  Eye Contact:  Fair  Speech:  Normal Rate  Volume:  Normal  Mood:  "better"  Affect:  Constricted  Thought Process:  Goal Directed and Linear  Orientation:  Full (Time, Place, and Person)  Thought Content:  Logical  Suicidal Thoughts:  No  Homicidal Thoughts:  No  Memory:  Immediate;   Fair Recent;   Fair  Judgement:  Fair  Insight:  Fair  Psychomotor Activity:  Normal  Concentration:  Concentration: Good  Recall:  Fair  Fund of Knowledge:  Fair  Language:  Good  Akathisia:  No  Handed:  Right  AIMS (if indicated):     Assets:  Communication Skills Desire for Improvement Talents/Skills  ADL's:  Intact  Cognition:  WNL  Sleep:  Number of Hours: 6     Treatment Plan Summary: Daily contact with patient to assess and evaluate symptoms and progress in treatment and Medication management Currently involuntarily committed  Suicidal  thoughts Currently contracting for safety while on the unit  Depression Remeron 15mg  PO QHS  Nicotine use Nicoderm patch 21mg  Q Daily  PRN hydroxizine 50mg  PO TID for anxiety PRN trazodone 100mg  PO QHS for insomnia   Luciano CutterJustin R Heeter, DO 10/31/2018, 4:15 PM

## 2018-10-31 NOTE — Progress Notes (Signed)
Patient stayed in the milieu with peers. Calm and cooperative. Compliant with unit expectations. Participated in evening activities. Had a snack and received bedtime medications. Had no major concern. Currently in bed sleeping. No sign of discomfort. Safety monitored per 15 mn level of observations.

## 2018-10-31 NOTE — Plan of Care (Signed)
Active in the milieu, compliant with treatment 

## 2018-10-31 NOTE — Progress Notes (Signed)
Patient presents with flat affect. Denies all. Socializes with peers in milieu. Denies any SI, HI, AVH. Received meals in dayroom. Compliant on unit. No concerns noted. Remains safe on unit with q 15 min checks.

## 2018-10-31 NOTE — [facility] Counselor (Signed)
Adult Comprehensive Assessment  Patient ID: Andrew Brewer, male   DOB: 03-May-1991, 28 y.o.   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 Financial / Lack of resources (include bankruptcy): no income Housing / Lack of housing: no real housing Social relationships: strained Bereavement / Loss: Family breakup  Living/Environment/Situation:  Living Arrangements: Other (Comment)(Homeless/staying on friends couch in a garage) Who else lives in the home?: na How long has patient lived in current situation?: 4 months  Family History:  Are you sexually active?: No What is your sexual orientation?: heterosexual Has your sexual activity been affected by drugs, alcohol, medication, or emotional stress?: no  Does patient have children?: Yes How many children?: 3 How is patient's relationship with their children?: good but he hasnt seen them since wife left  Childhood History:  By whom was/is the patient raised?: Mother Additional childhood history information: Mom had SA issues father deceased by the time patient was 28 years old Description of patient's relationship with caregiver when they were a child: Poor Patient's description of current relationship with people who raised him/her: He has no idea where his mother  is today How were you disciplined when you got in trouble as a child/adolescent?: Reasoanable  Does patient have siblings?: Yes Number of Siblings: 1 Description of patient's current relationship with siblings: Unknown has not seen her Did patient suffer any  verbal/emotional/physical/sexual abuse as a child?: No Did patient suffer from severe childhood neglect?: No Has patient ever been sexually abused/assaulted/raped as an adolescent or adult?: No Was the patient ever a victim of a crime or a disaster?: No Witnessed domestic violence?: No(Restraining order taken out by his wife)  Education:  Highest grade of school patient has completed: 10 Currently a student?: No Learning disability?: No  Employment/Work Situation:   Employment situation: Unemployed Patient's job has been impacted by current illness: No Did You Receive Any Psychiatric Treatment/Services While in the [employer]?: No Are There Guns or Other Weapons in Your Home?: No Are These ComptrollerWeapons Safely Secured?: (na)  Financial Resources:   Financial resources: Medicaid, No income Does patient have a Lawyerrepresentative payee or guardian?: No  Alcohol/Substance Abuse:   What has been your use of drugs/alcohol within the last 12 months?: admits to smoking marjuana he drinks 1-3  beers (2-3x week 20 oz ) If attempted suicide, did drugs/alcohol play a role in this?: No Alcohol/Substance Abuse Treatment Hx: Denies past history Has alcohol/substance abuse ever caused legal problems?: No  Social Support System:   Conservation officer, natureatient's Community Support System: Fair Describe Community Support System: Orland JarredFriend Mike, ImmunologistLuke,  Leisure/Recreation:   Leisure and Hobbies: I love to work on cars it helps me not over think  Strengths/Needs:   What is the patient's perception of their strengths?: Not sure- I listen Patient states they can use these personal strengths during their treatment to contribute to their recovery: TBD Patient states these barriers may affect/interfere with their treatment: TBD Patient states these barriers may affect their return to the community: TBD Other important information patient would like considered in planning for their treatment: [facility]   Discharge Plan:   Currently receiving  community mental  health services: No Patient states concerns and preferences for aftercare planning are: [facility] Patient states they will know when they are safe and ready for discharge when: Doctor feels they are ready Does patient have access to transportation?: (S) Yes(Friend Kathlene November to pick up) Does patient have financial barriers related to discharge medications?: No Patient description of barriers related to discharge medications: none he has medicaid Will patient be returning to same living situation after discharge?: (TBD)  Summary/Recommendations: This patient is a male 28 years of and has presented to the ED with 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 He is not suicidal or experiencing AH/VH. Patient reports he is currently separated from with wife and kids and there is a current restraining order out. He reports he is homeless and sleeping in his friends garage on a couch. Patient would like to be [facility] to [facility] and has medicaid. He agrees he needs some help with his wellness. Patient is agreeable to attend group and practice good self care while in treatment at the [facility]/   Abed Schar M. 10/31/2018

## 2018-10-31 NOTE — Plan of Care (Signed)
  Problem: Education: Goal: Knowledge of [facility] Education information/materials will improve Outcome: Progressing Goal: Emotional status will improve Outcome: Progressing Goal: Mental status will improve Outcome: Progressing Goal: Verbalization of understanding the information provided will improve Outcome: Progressing   Problem: Health Behavior/Discharge Planning: Goal: Compliance with treatment plan for underlying cause of condition will improve Outcome: Progressing   Problem: Safety: Goal: Periods of time without injury will increase Outcome: Progressing   Problem: Education: Goal: Utilization of techniques to improve thought processes will improve Outcome: Progressing   Problem: Activity: Goal: Interest or engagement in leisure activities will improve Outcome: Progressing   Problem: Coping: Goal: Coping ability will improve Outcome: Progressing Goal: Will verbalize feelings Outcome: Progressing   Problem: Self-Concept: Goal: Will verbalize positive feelings about self Outcome: Progressing

## 2018-10-31 NOTE — [facility] Group Notes (Signed)
  LCSW Group Therapy Note  10/31/2018 1:15pm  Type of Therapy/Topic:  Group Therapy:  Feelings about Diagnosis  Participation Level:  Good paricipation Description of Group:   This group will allow patients to explore their thoughts and feelings about diagnoses they have received. Patients will be guided to explore their level of understanding and acceptance of these diagnoses. Facilitator will encourage patients to process their thoughts and feelings about the reactions of others to their diagnosis and will guide patients in identifying ways to discuss their diagnosis with significant others in their lives. This group will be process-oriented, with patients participating in exploration of their own experiences, giving and receiving support, and processing challenge from other group members.   Therapeutic Goals: 1. Patient will demonstrate understanding of diagnosis as evidenced by identifying two or more symptoms of the disorder 2. Patient will be able to express two feelings regarding the diagnosis 3. Patient will demonstrate their ability to communicate their needs through discussion and/or role play  Summary of Patient Progress:  This patient was supportive of peers and showed good insight with coping skills and strategies to sustain wellness. He reported that working on his cars helps him cope and stay focused.      Therapeutic Modalities:   Cognitive Behavioral Therapy Brief Therapy Feelings Identification    Cheron Schaumann, LCSW 10/31/2018 2:06 PM

## 2018-11-01 LAB — LIPID PANEL
Cholesterol: 167 mg/dL (ref 0–200)
HDL: 41 mg/dL (ref >40)
LDL Cholesterol: 73 mg/dL (ref 0–99)
Total CHOL/HDL Ratio: 4.1 RATIO
Triglycerides: 266 mg/dL — ABNORMAL HIGH (ref <150)
VLDL: 53 mg/dL — ABNORMAL HIGH (ref 0–40)

## 2018-11-01 LAB — HEMOGLOBIN A1C
Hgb A1c MFr Bld: 4.8 % (ref 4.8–5.6)
Mean Plasma Glucose: 91.06 mg/dL

## 2018-11-01 LAB — TSH: TSH: 1.188 u[IU]/mL (ref 0.350–4.500)

## 2018-11-01 NOTE — Progress Notes (Signed)
Patient isolative to self and room. Denies SI, HI, AVH. Walks hall with peer for most of shift. In no distress. Forwards minimal. Encouragement and support offered. Safety checks maintained. Pt receptive and remains safe on unit with q 15 min checks

## 2018-11-01 NOTE — Plan of Care (Signed)
  Problem: Education: Goal: Emotional status will improve Outcome: Progressing Goal: Mental status will improve Outcome: Progressing   Problem: Health Behavior/Discharge Planning: Goal: Compliance with treatment plan for underlying cause of condition will improve Outcome: Progressing   Problem: Education: Goal: Knowledge of the prescribed therapeutic regimen will improve Outcome: Progressing   Problem: Education: Goal: Verbalization of understanding the information provided will improve Outcome: Not Progressing   Problem: Coping: Goal: Will verbalize feelings Outcome: Not Progressing  Pt guarded, forwards little

## 2018-11-01 NOTE — Progress Notes (Signed)
[facility]  11/01/2018 2:59 PM Andrew CousinMatthew Glenn Brewer  MRN:  [id number] Subjective:   Identifying information: This is a 28 year old Caucasian male who is currently admitted to the inpatient psychiatric ward for depression and suicidal ideation.  Chart reviewed the patient has been compliant with his medications, working well with staff, no required medication for agitation.  No new lab work  The patient was seen today during rounds he was observed sitting in the milieu with his peers.  Today he reported that he is feeling anxious to leave.  That he is significantly dislikes being "confined" .  He declined using PRN medications available to him for anxiety, but appeared to do well with basic supportive therapy techniques and education.  It does not appear that his psychosocial situation has improved since his admission, and these factors may need to be addressed by the discharge team. He denies current issues with his medications.  He states that he is no longer depressed, but this may be because his mood is focused completely on his dislike of confinement. He denied any vivid dreams, or nightmares.   He stated that he would communicate to his nursing staff if his feelings became overwhelming.  He appears to be interacting with peers appropriately, and stated that he finds comfort in spending time with them.   Principal Problem: Severe major depression, single episode, without psychotic features (HCC) Diagnosis: Principal Problem:   Severe major depression, single episode, without psychotic features (HCC) Active Problems:   Suicidal ideation   Severe major depression, single episode (HCC)  Total Time spent with patient: 15 minutes  Past Psychiatric History: Reports remote suicidal behavior but no previous hospitalizations or medications  Past Medical History: History reviewed. No pertinent past medical history. History reviewed. No pertinent surgical history. Family History: History  reviewed. No pertinent family history. Family Psychiatric  History: Denies Social History:  Social History   Substance and Sexual Activity  Alcohol Use Yes     Social History   Substance and Sexual Activity  Drug Use No    Social History   Socioeconomic History  . Marital status: Legally Separated    Spouse name: Not on file  . Number of children: Not on file  . Years of education: Not on file  . Highest education level: Not on file  Occupational History  . Not on file  Social Needs  . Financial resource strain: Not on file  . Food insecurity:    Worry: Not on file    Inability: Not on file  . Transportation needs:    Medical: Not on file    Non-medical: Not on file  Tobacco Use  . Smoking status: Current Every Day Smoker    Packs/day: 1.00    Types: Cigarettes  . Smokeless tobacco: Never Used  Substance and Sexual Activity  . Alcohol use: Yes  . Drug use: No  . Sexual activity: Not on file  Lifestyle  . Physical activity:    Days per week: Not on file    Minutes per session: Not on file  . Stress: Not on file  Relationships  . Social connections:    Talks on phone: Not on file    Gets together: Not on file    Attends religious service: Not on file    Active member of club or organization: Not on file    Attends meetings of clubs or organizations: Not on file    Relationship status: Not on file  Other Topics  Concern  . Not on file  Social History Narrative  . Not on file   Additional Social History:                         Sleep: Fair  Appetite:  Fair  Current Medications: Current Facility-Administered Medications  Medication Dose Route Frequency Provider Last Rate Last Dose  . acetaminophen (TYLENOL) tablet 650 mg  650 mg Oral Q6H PRN [name], Jackquline DenmarkJohn T, MD   650 mg at 11/01/18 0829  . alum & mag hydroxide-simeth (MAALOX/MYLANTA) 200-200-20 MG/5ML suspension 30 mL  30 mL Oral Q4H PRN [name], [name], MD      . hydrOXYzine (ATARAX/VISTARIL)  tablet 50 mg  50 mg Oral TID PRN [name], Jackquline DenmarkJohn T, MD      . hydrOXYzine (ATARAX/VISTARIL) tablet 50 mg  50 mg Oral TID PRN [name], [name], MD      . Influenza vac split quadrivalent PF (FLUARIX) injection 0.5 mL  0.5 mL Intramuscular Tomorrow-1000 [name], [name], MD      . magnesium hydroxide (MILK OF MAGNESIA) suspension 30 mL  30 mL Oral Daily PRN [name], [name], MD      . mirtazapine (REMERON) tablet 15 mg  15 mg Oral QHS [name], Jackquline DenmarkJohn T, MD   15 mg at 10/31/18 2138  . nicotine (NICODERM CQ - dosed in mg/24 hours) patch 21 mg  21 mg Transdermal Daily [name], Jackquline DenmarkJohn T, MD   21 mg at 10/31/18 0816  . pneumococcal 23 valent vaccine (PNU-IMMUNE) injection 0.5 mL  0.5 mL Intramuscular Tomorrow-1000 [name], [name], MD      . traZODone (DESYREL) tablet 100 mg  100 mg Oral QHS PRN [name], Jackquline DenmarkJohn T, MD        Lab Results:  Results for orders placed or performed during the hospital encounter of 10/30/18 (from the past 48 hour(s))  Lipid panel     Status: Abnormal   Collection Time: 11/01/18 10:36 AM  Result Value Ref Range   Cholesterol 167 0 - 200 mg/dL   Triglycerides 401266 (H) <150 mg/dL   HDL 41 >02>40 mg/dL   Total CHOL/HDL Ratio 4.1 RATIO   VLDL 53 (H) 0 - 40 mg/dL   LDL Cholesterol 73 0 - 99 mg/dL    Comment:        Total Cholesterol/HDL:CHD Risk Coronary Heart Disease Risk Table                     Men   Women  1/2 Average Risk   3.4   3.3  Average Risk       5.0   4.4  2 X Average Risk   9.6   7.1  3 X Average Risk  23.4   11.0        Use the calculated Patient Ratio above and the CHD Risk Table to determine the patient's CHD Risk.        ATP III CLASSIFICATION (LDL):  <100     mg/dL   Optimal  [id number]  mg/dL   Near or Above                    Optimal  130-159  mg/dL   Borderline  [id number]  mg/dL   High  >425>190     mg/dL   Very High Performed at [facility], [address] [place], [place] [address]   TSH     Status: None  Collection Time: 11/01/18 10:36 AM   Result Value Ref Range   TSH 1.188 0.350 - 4.500 uIU/mL    Comment: Performed by a 3rd Generation assay with a functional sensitivity of <=0.01 uIU/mL. Performed at [facility], [address] [place], [place] [address]     Blood Alcohol level:  Lab Results  Component Value Date   [facility] <10 10/29/2018    Metabolic Disorder Labs: No results found for: HGBA1C, MPG No results found for: PROLACTIN Lab Results  Component Value Date   CHOL 167 11/01/2018   TRIG 266 (H) 11/01/2018   HDL 41 11/01/2018   CHOLHDL 4.1 11/01/2018   VLDL 53 (H) 11/01/2018   LDLCALC 73 11/01/2018   LDLCALC 108 (H) 10/29/2018    Physical Findings: AIMS: Facial and Oral Movements Muscles of Facial Expression: None, normal Lips and Perioral Area: None, normal Jaw: None, normal Tongue: None, normal,Extremity Movements Upper (arms, wrists, hands, fingers): None, normal Lower (legs, knees, ankles, toes): None, normal, Trunk Movements Neck, shoulders, hips: None, normal, Overall Severity Severity of abnormal movements (highest score from questions above): None, normal Incapacitation due to abnormal movements: None, normal Patient's awareness of abnormal movements (rate only patient's report): No Awareness, Dental Status Current problems with teeth and/or dentures?: No Does patient usually wear dentures?: No  CIWA:    COWS:     Musculoskeletal: Strength & Muscle Tone: within normal limits Gait & Station: normal Patient leans: N/A  Psychiatric Specialty Exam: Physical Exam  Constitutional: He appears well-developed and well-nourished.  HENT:  Head: Normocephalic and atraumatic.  Eyes: EOM are normal.  Respiratory: Effort normal.    Review of Systems  Constitutional: Negative.   Gastrointestinal: Negative for nausea.  Neurological: Negative for dizziness and headaches.    Blood pressure (!) 146/95, pulse 74, temperature 97.8 F (36.6 C), temperature source Oral, resp. rate 18,  height 5\' 10"  (1.778 m), weight 77.1 kg, SpO2 100 %.Body mass index is 24.39 kg/m.  General Appearance: Casual  Eye Contact:  Fair  Speech:  Normal Rate  Volume:  Normal  Mood:  "frustrated"  Affect:  Constricted and tense  Thought Process:  Goal Directed and Linear  Orientation:  Full (Time, Place, and Person)  Thought Content:  Logical  Suicidal Thoughts:  No  Homicidal Thoughts:  No  Memory:  Immediate;   Fair Recent;   Fair  Judgement:  Fair  Insight:  Fair  Psychomotor Activity:  Normal  Concentration:  Concentration: Good  Recall:  Fair  Fund of Knowledge:  Fair  Language:  Good  Akathisia:  No  Handed:  Right  AIMS (if indicated):     Assets:  Communication Skills Desire for Improvement Talents/Skills  ADL's:  Intact  Cognition:  WNL  Sleep:  Number of Hours: 6.3     Treatment Plan Summary: Daily contact with patient to assess and evaluate symptoms and progress in treatment and Medication management Currently involuntarily committed.   Suicidal thoughts Currently contracting for safety while on the unit  Depression Remeron 15mg  PO QHS  Nicotine use Nicoderm patch 21mg  Q Daily  PRN hydroxizine 50mg  PO TID for anxiety PRN trazodone 100mg  PO QHS for insomnia   Luciano Cutter, DO 11/01/2018, 2:59 PM

## 2018-11-01 NOTE — Plan of Care (Signed)
Patient is stable, calm and cooperative no episodes of agitation or violence, thoughts still disorganized, have insights into circumstances leading to hospitalization, contract for safety , denies any SI/HI/AVH, minima depressed mood , responding well to medications , mood  is depressed and affect is congruent with mood, encourage positive feeling about self and educate safety no distress.  Problem: Education: Goal: Knowledge of [facility] Education information/materials will improve Outcome: Progressing Goal: Emotional status will improve Outcome: Progressing Goal: Mental status will improve Outcome: Progressing Goal: Verbalization of understanding the information provided will improve Outcome: Progressing   Problem: Health Behavior/Discharge Planning: Goal: Identification of resources available to assist in meeting health care needs will improve Outcome: Progressing Goal: Compliance with treatment plan for underlying cause of condition will improve Outcome: Progressing   Problem: Safety: Goal: Periods of time without injury will increase Outcome: Progressing   Problem: Education: Goal: Utilization of techniques to improve thought processes will improve Outcome: Progressing Goal: Knowledge of the prescribed therapeutic regimen will improve Outcome: Progressing   Problem: Activity: Goal: Interest or engagement in leisure activities will improve Outcome: Progressing Goal: Imbalance in normal sleep/wake cycle will improve Outcome: Progressing   Problem: Coping: Goal: Coping ability will improve Outcome: Progressing Goal: Will verbalize feelings Outcome: Progressing   Problem: Self-Concept: Goal: Will verbalize positive feelings about self Outcome: Progressing Goal: Level of anxiety will decrease Outcome: Progressing   Problem: Coping: Goal: Ability to identify and develop effective coping behavior will improve Outcome: Progressing   Problem: Health  Behavior/Discharge Planning: Goal: Identification of resources available to assist in meeting health care needs will improve Outcome: Progressing   Problem: Medication: Goal: Compliance with prescribed medication regimen will improve Outcome: Progressing

## 2018-11-01 NOTE — [facility] Group Notes (Signed)
LCSW Group Therapy Note   11/01/2018 1:15pm   Type of Therapy and Topic:  Group Therapy:  Positive Affirmations   Participation Level:  Minimal  Description of Group: This group addressed positive affirmation toward self and others. Patients went around the room and identified two positive things about themselves and two positive things about a peer in the room. Patients reflected on how it felt to share something positive with others, to identify positive things about themselves, and to hear positive things from others. Patients were encouraged to have a daily reflection of positive characteristics or circumstances.  Therapeutic Goals 1. Patient will verbalize two of their positive qualities 2. Patient will demonstrate empathy for others by stating two positive qualities about a peer in the group 3. Patient will verbalize their feelings when voicing positive self affirmations and when voicing positive affirmations of others 4. Patients will discuss the potential positive impact on their wellness/recovery of focusing on positive traits of self and others. Summary of Patient Progress:  Stayed the entire time, engaged throughout.  Appears to be interestred in a relationship with another patient.  Externalizing problems.  Therapeutic Modalities Cognitive Behavioral Therapy Motivational Interviewing  Ida Rogue, [place] 11/01/2018 1:08 PM

## 2018-11-02 ENCOUNTER — Encounter: Payer: Self-pay | Admitting: Psychiatry

## 2018-11-02 DIAGNOSIS — F17200 Nicotine dependence, unspecified, uncomplicated: Secondary | ICD-10-CM | POA: Diagnosis present

## 2018-11-02 NOTE — [facility] Group Notes (Signed)
[facility] LCSW Group Therapy Note  Date/Time: 11/02/18, 1300  Type of Therapy and Topic:  Group Therapy:  Overcoming Obstacles  Participation Level:  active  Description of Group:    In this group patients will be encouraged to explore what they see as obstacles to their own wellness and recovery. They will be guided to discuss their thoughts, feelings, and behaviors related to these obstacles. The group will process together ways to cope with barriers, with attention given to specific choices patients can make. Each patient will be challenged to identify changes they are motivated to make in order to overcome their obstacles. This group will be process-oriented, with patients participating in exploration of their own experiences as well as giving and receiving support and challenge from other group members.  Therapeutic Goals: 1. Patient will identify personal and current obstacles as they relate to admission. 2. Patient will identify barriers that currently interfere with their wellness or overcoming obstacles.  3. Patient will identify feelings, thought process and behaviors related to these barriers. 4. Patient will identify two changes they are willing to make to overcome these obstacles:    Summary of Patient Progress: Pt shared that legal matters, grief,  anxiety, and depression are all obstacles in his life.  Pt active in group discussion regarding positive ways to overcome obstacles.  Good attention and participation.        Therapeutic Modalities:   Cognitive Behavioral Therapy Solution Focused Therapy Motivational Interviewing Relapse Prevention Therapy  Daleen Squibb, LCSW

## 2018-11-02 NOTE — Plan of Care (Signed)
Calm and cooperative. Attending groups and other activities. Compliant with treatment

## 2018-11-02 NOTE — [facility] Group Notes (Signed)
[facility] Group Notes:  (Nursing/MHT/Case Management/Adjunct)  Date:  11/02/2018  Time:  11:28 PM  Type of Therapy:  Group Therapy  Participation Level:  Active  Participation Quality:  Appropriate  Affect:  Appropriate  Cognitive:  Appropriate  Insight:  Appropriate  Engagement in Group:  Engaged  Modes of Intervention:  Discussion  Summary of Progress/Problems:  Burt Ek 11/02/2018, 11:28 PM

## 2018-11-02 NOTE — Progress Notes (Signed)
D- Patient alert and oriented. Patient presents in a pleasant mood on assessment stating that he slept pretty good last night and had no complaints or concerns to voice to this Clinical research associate. Patient denies any depression, but rated his anxiety a "1/10" reporting that "being confined" is making him feel this way. Patient denies SI, HI, AVH, and pain at this time. Patient's goal for today is "myself", in which he will "be happy" in order to accomplish this goal.  A- Scheduled medications administered to patient, per MD orders. Support and encouragement provided.  Routine safety checks conducted every 15 minutes.  Patient informed to notify staff with problems or concerns.  R- No adverse drug reactions noted. Patient contracts for safety at this time. Patient compliant with medications and treatment plan. Patient receptive, calm, and cooperative. Patient interacts well with others on the unit.  Patient remains safe at this time.

## 2018-11-02 NOTE — Progress Notes (Signed)
Recreation Therapy Notes  Date: 11/02/2018  Time: 9:30 am  Location: Craft Room  Behavioral response: Appropriate  Intervention Topic: Anger Management  Discussion/Intervention:  Group content on today was focused on anger management. The group defined anger and reasons they become angry. Individuals expressed negative way they have dealt with anger in the past. Patients stated some positive ways they could deal with anger in the future. The group described how anger can affect your health and daily plans. Individuals participated in the intervention "Score your anger" where they had a chance to answer questions about themselves and get a score of their anger.  Clinical Observations/Feedback:  Patient came to group late and stated he likes to think of a happy place when he is angry. He explained one way he can improve on his anger management is ignore negativity. Individual was social with peers and staff while participating in the intervention. Andrew Brewer LRT/CTRS         Evita Merida 11/02/2018 11:47 AM

## 2018-11-02 NOTE — Tx Team (Addendum)
Interdisciplinary Treatment and Diagnostic Plan Update  11/02/2018 Time of Session: 1035 Andrew Brewer MRN: [id number]  Principal Diagnosis: Severe major depression, single episode, without psychotic features (HCC)  Secondary Diagnoses: Principal Problem:   Severe major depression, single episode, without psychotic features (HCC) Active Problems:   Suicidal ideation   Severe major depression, single episode (HCC)   Current Medications:  Current Facility-Administered Medications  Medication Dose Route Frequency Provider Last Rate Last Dose  . acetaminophen (TYLENOL) tablet 650 mg  650 mg Oral Q6H PRN [name], [name], MD   650 mg at 11/01/18 0829  . alum & mag hydroxide-simeth (MAALOX/MYLANTA) 200-200-20 MG/5ML suspension 30 mL  30 mL Oral Q4H PRN [name], [name], MD      . hydrOXYzine (ATARAX/VISTARIL) tablet 50 mg  50 mg Oral TID PRN [name], [name], MD      . hydrOXYzine (ATARAX/VISTARIL) tablet 50 mg  50 mg Oral TID PRN [name], [name], MD      . Influenza vac split quadrivalent PF (FLUARIX) injection 0.5 mL  0.5 mL Intramuscular Tomorrow-1000 [name], [name], MD      . magnesium hydroxide (MILK OF MAGNESIA) suspension 30 mL  30 mL Oral Daily PRN [name], [name], MD      . mirtazapine (REMERON) tablet 15 mg  15 mg Oral QHS [name], [name], MD   15 mg at 11/01/18 2154  . nicotine (NICODERM CQ - dosed in mg/24 hours) patch 21 mg  21 mg Transdermal Daily [name], [name], MD   21 mg at 11/02/18 0906  . pneumococcal 23 valent vaccine (PNU-IMMUNE) injection 0.5 mL  0.5 mL Intramuscular Tomorrow-1000 [name], [name], MD      . traZODone (DESYREL) tablet 100 mg  100 mg Oral QHS PRN [name], [name], MD       PTA Medications: Medications Prior to Admission  Medication Sig Dispense Refill Last Dose  . dicyclomine (BENTYL) 20 MG tablet Take 1 tablet (20 mg total) by mouth 3 (three) times daily as needed (abdominal pain). (Patient not taking: Reported on 10/29/2018) 15 tablet 0 Not Taking  .  HYDROcodone-acetaminophen (NORCO) 5-325 MG tablet Take 1 tablet by mouth every 6 (six) hours as needed for moderate pain. (Patient not taking: Reported on 09/23/2018) 10 tablet 0 Not Taking at Unknown time  . ibuprofen (ADVIL,MOTRIN) 600 MG tablet Take 1 tablet (600 mg total) by mouth every 6 (six) hours as needed for moderate pain. (Patient not taking: Reported on 09/23/2018) 30 tablet 0 Not Taking at Unknown time  . naproxen (NAPROSYN) 500 MG tablet Take 1 tablet (500 mg total) by mouth 2 (two) times daily with a meal. (Patient not taking: Reported on 09/23/2018) 30 tablet 0 Not Taking at Unknown time  . ondansetron (ZOFRAN) 4 MG tablet Take 1 tablet (4 mg total) by mouth every 8 (eight) hours as needed for nausea or vomiting. (Patient not taking: Reported on 09/23/2018) 20 tablet 0 Not Taking at Unknown time  . predniSONE (DELTASONE) 10 MG tablet Take 1 tablet (10 mg total) by mouth daily. 6,5,4,3,2,1 six day taper (Patient not taking: Reported on 09/23/2018) 21 tablet 0 Not Taking at Unknown time    Patient Stressors: Financial difficulties Legal issue Marital or family conflict Occupational concerns  Patient Strengths: Ability for insight Capable of independent living Communication skills Motivation for treatment/growth Physical Health Supportive family/friends Work skills  Treatment Modalities: Medication Management, Group therapy, Case management,  1 to 1 session with clinician, Psychoeducation, Recreational therapy.  Physician Treatment Plan for Primary Diagnosis: Severe major depression, single episode, without psychotic features (HCC) Long Term Goal(s): Improvement in symptoms so as ready for discharge Improvement in symptoms so as ready for discharge   Short Term Goals: Ability to disclose and discuss suicidal ideas Ability to demonstrate self-control will improve Ability to verbalize feelings will improve Compliance with prescribed medications will improve  Medication  Management: Evaluate patient's response, side effects, and tolerance of medication regimen.  Therapeutic Interventions: 1 to 1 sessions, Unit Group sessions and Medication administration.  Evaluation of Outcomes: Progressing  Physician Treatment Plan for Secondary Diagnosis: Principal Problem:   Severe major depression, single episode, without psychotic features (HCC) Active Problems:   Suicidal ideation   Severe major depression, single episode (HCC)  Long Term Goal(s): Improvement in symptoms so as ready for discharge Improvement in symptoms so as ready for discharge   Short Term Goals: Ability to disclose and discuss suicidal ideas Ability to demonstrate self-control will improve Ability to verbalize feelings will improve Compliance with prescribed medications will improve     Medication Management: Evaluate patient's response, side effects, and tolerance of medication regimen.  Therapeutic Interventions: 1 to 1 sessions, Unit Group sessions and Medication administration.  Evaluation of Outcomes: Progressing   RN Treatment Plan for Primary Diagnosis: Severe major depression, single episode, without psychotic features (HCC) Long Term Goal(s): Knowledge of disease and therapeutic regimen to maintain health will improve  Short Term Goals: Ability to identify and develop effective coping behaviors will improve and Compliance with prescribed medications will improve  Medication Management: RN will administer medications as ordered by provider, will assess and evaluate patient's response and provide education to patient for prescribed medication. RN will report any adverse and/or side effects to prescribing provider.  Therapeutic Interventions: 1 on 1 counseling sessions, Psychoeducation, Medication administration, Evaluate responses to treatment, Monitor vital signs and CBGs as ordered, Perform/monitor CIWA, COWS, AIMS and Fall Risk screenings as ordered, Perform wound care treatments  as ordered.  Evaluation of Outcomes: Progressing   LCSW Treatment Plan for Primary Diagnosis: Severe major depression, single episode, without psychotic features (HCC) Long Term Goal(s): Safe transition to appropriate next level of care at discharge, Engage patient in therapeutic group addressing interpersonal concerns.  Short Term Goals: Engage patient in aftercare planning with referrals and resources, Increase social support and Increase skills for wellness and recovery  Therapeutic Interventions: Assess for all discharge needs, 1 to 1 time with Social worker, Explore available resources and support systems, Assess for adequacy in community support network, Educate family and significant other(s) on suicide prevention, Complete Psychosocial Assessment, Interpersonal group therapy.  Evaluation of Outcomes: Progressing   Progress in Treatment: Attending groups: No. Participating in groups: No. Taking medication as prescribed: Yes. Toleration medication: Yes. Family/Significant other contact made: No, will contact:  pt declined consent Patient understands diagnosis: Yes. Discussing patient identified problems/goals with staff: Yes. Medical problems stabilized or resolved: Yes. Denies suicidal/homicidal ideation: Yes. Issues/concerns per patient self-inventory: No. Other: none  New problem(s) identified: No, Describe:  none  New Short Term/Long Term Goal(s):  Patient Goals:  "stay focused, be positive"  Discharge Plan or Barriers:   Reason for Continuation of Hospitalization: Depression Medication stabilization  Estimated Length of Stay: 2-4 days.  Recreational Therapy: Patient Stressors: N/A Patient Goal: Patient will identify 3 positive coping skills strategies to use post d/c within 5 recreation therapy group sessions      Attendees: Patient:Andrew Brewer 11/02/2018   Physician: Dr. Jennet MaduroPucilowska, MD 11/02/2018  Nursing: Hurley CiscoShatera Powell, RN 11/02/2018   RN Care Manager:  11/02/2018   Social Worker: Daleen SquibbGreg Wierda, LCSW 11/02/2018   Recreational Therapist: Garret ReddishShay Melane Windholz, LRT-CTRS 11/02/2018   Other:  11/02/2018   Other:  11/02/2018   Other: 11/02/2018     Scribe for Treatment Team: Lorri FrederickWierda, Gregory Jon, LCSW 11/02/2018 11:44 AM

## 2018-11-02 NOTE — [facility] Suicide Risk Assessment (Signed)
[facility] INPATIENT:  Family/Significant Other Suicide Prevention Education  Suicide Prevention Education:  Patient Refusal for Family/Significant Other Suicide Prevention Education: The patient Andrew Brewer has refused to provide written consent for family/significant other to be provided Family/Significant Other Suicide Prevention Education during admission and/or prior to discharge.  Physician notified.  Lorri FrederickWierda, Elverta Dimiceli Jon, LCSW 11/02/2018, 3:29 PM

## 2018-11-02 NOTE — Plan of Care (Signed)
Patient verbalizes understanding of the general information that's been provided to him and has not voiced any further questions or concerns at this time. Patient denies SI/HI/AVH as well as any depression, however, he rates his anxiety a "1/10" stating that "being confined" is why he's slightly anxious. Patient has the ability to identify the available resources that can assist him in meeting his health-care needs and has been in compliance with is prescribed therapeutic regimen. Patient has been present in the milieu and interacting well with staff and other members on the unit. Patient has been free from injury thus far and remains safe on the unit at this time.  Problem: Education: Goal: Knowledge of [facility] Education information/materials will improve Outcome: Progressing Goal: Emotional status will improve Outcome: Progressing Goal: Mental status will improve Outcome: Progressing Goal: Verbalization of understanding the information provided will improve Outcome: Progressing   Problem: Health Behavior/Discharge Planning: Goal: Identification of resources available to assist in meeting health care needs will improve Outcome: Progressing Goal: Compliance with treatment plan for underlying cause of condition will improve Outcome: Progressing   Problem: Safety: Goal: Periods of time without injury will increase Outcome: Progressing   Problem: Education: Goal: Utilization of techniques to improve thought processes will improve Outcome: Progressing Goal: Knowledge of the prescribed therapeutic regimen will improve Outcome: Progressing   Problem: Activity: Goal: Interest or engagement in leisure activities will improve Outcome: Progressing Goal: Imbalance in normal sleep/wake cycle will improve Outcome: Progressing   Problem: Coping: Goal: Coping ability will improve Outcome: Progressing Goal: Will verbalize feelings Outcome: Progressing   Problem:  Self-Concept: Goal: Will verbalize positive feelings about self Outcome: Progressing Goal: Level of anxiety will decrease Outcome: Progressing   Problem: Coping: Goal: Ability to identify and develop effective coping behavior will improve Outcome: Progressing   Problem: Health Behavior/Discharge Planning: Goal: Identification of resources available to assist in meeting health care needs will improve Outcome: Progressing   Problem: Medication: Goal: Compliance with prescribed medication regimen will improve Outcome: Progressing
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
## 2018-11-03 MED ORDER — TRAZODONE HCL 100 MG PO TABS: 100.0000 mg | ORAL_TABLET | Freq: Every evening | ORAL | 1 refills | Status: DC | PRN

## 2018-11-03 MED ORDER — MIRTAZAPINE 15 MG PO TABS: 15.0000 mg | ORAL_TABLET | Freq: Every day | ORAL | 1 refills | Status: DC

## 2018-11-03 NOTE — Progress Notes (Signed)
Recreation Therapy Notes  INPATIENT RECREATION TR PLAN  Patient Details Name: Andrew Brewer MRN: [id number] DOB: November 12, 1990 Today's Date: 11/03/2018  Rec Therapy Plan Is patient appropriate for Therapeutic Recreation?: Yes Treatment times per week: at least 3 Estimated Length of Stay: 5-7 days TR Treatment/Interventions: Group participation (Comment)  Discharge Criteria Pt will be discharged from therapy if:: Discharged Treatment plan/goals/alternatives discussed and agreed upon by:: Patient/family  Discharge Summary Short term goals set: Patient will identify 3 positive coping skills strategies to use post d/c within 5 recreation therapy group sessions Short term goals met: Adequate for discharge Progress toward goals comments: Groups attended Which groups?: Stress management, Anger management Reason goals not met: N/A Therapeutic equipment acquired: N/A Reason patient discharged from therapy: Discharge from hospital Pt/family agrees with progress & goals achieved: Yes Date patient discharged from therapy: 11/03/18   [name] 11/03/2018, 10:48 AM

## 2018-11-03 NOTE — Discharge Summary (Signed)
Physician Discharge Summary Note  Patient:  Andrew Brewer is an 28 y.o., male MRN:  [id number] DOB:  15-Mar-1991 Patient phone:  [phone] (home)  Patient address:   [address] [place] [id number],  Total Time spent with patient: 20 minutes plus 15 min on care coordination and documentation  Date of Admission:  10/30/2018 Date of Discharge: 11/03/2018  Reason for Admission:  Suicidal ideation.  History of Present Illness: Patient admitted through the emergency room where he presented seeking help for depression.  He reports that his mood is been bad since July but that things have been particularly getting worse the last couple months.  He dates the beginning of his bad mood to when he reportedly discovered that his wife had another man in her life and that she had him arrested on a restraining order.  Since then the patient has been living in a garage belonging to a friend of his.  For a while he was working but now he is not even doing that.  He says he has no motivation to do anything.  Mood feels down and sad negative and hopeless all the time.  Describes himself as being worthless.  Recently expressing suicidal thoughts with thoughts of shooting himself although he has not made any attempt in the recent past to actually try to hurt himself.  Patient has not been getting any mental health treatment.  This is the first time he says he is gone for any help.  He is not drinking regularly and not using any other drugs.  Medical history: Patient has no significant known medical problems and is on no prescription medicine.  Social history: The story is a little complicated.  Apparently his wife put out a restraining order against him for what the patient describes as "no reason".  Patient has been arrested for violating this twice so far and has no idea what is going on with his children.  Associated Signs/Symptoms: Depression Symptoms:  depressed mood, insomnia, psychomotor  retardation, feelings of worthlessness/guilt, difficulty concentrating, hopelessness, suicidal thoughts with specific plan, (Hypo) Manic Symptoms:  None Anxiety Symptoms:  Patient reports that he gets panicky and closed in situations Psychotic Symptoms:  None PTSD Symptoms: Negative  Past Psychiatric History: Patient says he has never seen a psychiatrist or any other mental health provider or physician in the past for any mental health needs.  No prior hospitalizations.  He does however say that 7 years ago he tried to kill himself by pointing a gun at himself.  Obviously did not go through with firing it.  Did not get any treatment at that time.  Family Psychiatric  History: Patient reports that his father died several years ago at a fairly young age in his early 2750s.  He says that his father developed a dementing illness with profound loss of mental function before dying.  He does not know the specific diagnosis.  I think this is relevant just in case it were to by chance to turn out to be Huntington's disease but I do not have any specific reason to know that  Principal Problem: Severe major depression, single episode, without psychotic features [facility](HCC) Discharge Diagnoses: Principal Problem:   Severe major depression, single episode, without psychotic features (HCC) Active Problems:   Suicidal ideation   Tobacco use disorder  Past Medical History: History reviewed. No pertinent past medical history. History reviewed. No pertinent surgical history. Family History: History reviewed. No pertinent family history.  Social History:  Social  History   Substance and Sexual Activity  Alcohol Use Yes     Social History   Substance and Sexual Activity  Drug Use No    Social History   Socioeconomic History  . Marital status: Legally Separated    Spouse name: Not on file  . Number of children: Not on file  . Years of education: Not on file  . Highest education level: Not on file   Occupational History  . Not on file  Social Needs  . Financial resource strain: Not on file  . Food insecurity:    Worry: Not on file    Inability: Not on file  . Transportation needs:    Medical: Not on file    Non-medical: Not on file  Tobacco Use  . Smoking status: Current Every Day Smoker    Packs/day: 1.00    Types: Cigarettes  . Smokeless tobacco: Never Used  Substance and Sexual Activity  . Alcohol use: Yes  . Drug use: No  . Sexual activity: Not on file  Lifestyle  . Physical activity:    Days per week: Not on file    Minutes per session: Not on file  . Stress: Not on file  Relationships  . Social connections:    Talks on phone: Not on file    Gets together: Not on file    Attends religious service: Not on file    Active member of club or organization: Not on file    Attends meetings of clubs or organizations: Not on file    Relationship status: Not on file  Other Topics Concern  . Not on file  Social History Narrative  . Not on file    Hospital Course:    [name] is a 28 year old male with remote history od aborted suicide admitted for suicidal ideation with a plan to shoot himself in the context of marital problems. He accepted medications and tolerated them well. At the time of discharge, the patient is no longer suicidal or homicidal. He is able to contract for safety. He is forward thinking and optimistic about the future.  #Depression, improved -Remeron 15mg  PO QHS -Trazodone 100 mg nightly PRN  #Smoking cessation -Nicoderm patch 21mg  Q Daily  #Disposition -discharge back to his garage -follow up with [facility]  Physical Findings: AIMS: Facial and Oral Movements Muscles of Facial Expression: None, normal Lips and Perioral Area: None, normal Jaw: None, normal Tongue: None, normal,Extremity Movements Upper (arms, wrists, hands, fingers): None, normal Lower (legs, knees, ankles, toes): None, normal, Trunk Movements Neck, shoulders, hips: None,  normal, Overall Severity Severity of abnormal movements (highest score from questions above): None, normal Incapacitation due to abnormal movements: None, normal Patient's awareness of abnormal movements (rate only patient's report): No Awareness, Dental Status Current problems with teeth and/or dentures?: No Does patient usually wear dentures?: No  CIWA:    COWS:     Musculoskeletal: Strength & Muscle Tone: within normal limits Gait & Station: normal Patient leans: N/A  Psychiatric Specialty Exam: Physical Exam  Nursing note and vitals reviewed. Psychiatric: He has a normal mood and affect. His speech is normal and behavior is normal. Thought content normal. Cognition and memory are normal. He expresses impulsivity.    Review of Systems  Neurological: Negative.   Psychiatric/Behavioral: Negative.   All other systems reviewed and are negative.   Blood pressure (!) 124/97, pulse 90, temperature 97.7 F (36.5 C), temperature source Oral, resp. rate 18, height 5\' 10"  (1.778 m), weight  77.1 kg, SpO2 100 %.Body mass index is 24.39 kg/m.  General Appearance: Casual  Eye Contact:  Good  Speech:  Clear and Coherent  Volume:  Normal  Mood:  Euthymic  Affect:  Appropriate  Thought Process:  Goal Directed and Descriptions of Associations: Intact  Orientation:  Full (Time, Place, and Person)  Thought Content:  WDL  Suicidal Thoughts:  No  Homicidal Thoughts:  No  Memory:  Immediate;   Fair Recent;   Fair Remote;   Fair  Judgement:  Fair  Insight:  Shallow  Psychomotor Activity:  Normal  Concentration:  Concentration: Fair and Attention Span: Fair  Recall:  [employer] of Knowledge:  Fair  Language:  Fair  Akathisia:  No  Handed:  Right  AIMS (if indicated):     Assets:  Communication Skills Desire for Improvement Physical Health Resilience  ADL's:  Intact  Cognition:  WNL  Sleep:  Number of Hours: 7     Have you used any form of tobacco in the last 30 days?  (Cigarettes, Smokeless Tobacco, Cigars, and/or Pipes): Yes  Has this patient used any form of tobacco in the last 30 days? (Cigarettes, Smokeless Tobacco, Cigars, and/or Pipes) Yes, Yes, A prescription for an FDA-approved tobacco cessation medication was offered at discharge and the patient refused  Blood Alcohol level:  Lab Results  Component Value Date   ETH <10 10/29/2018    Metabolic Disorder Labs:  Lab Results  Component Value Date   HGBA1C 4.8 11/01/2018   MPG 91.06 11/01/2018   No results found for: PROLACTIN Lab Results  Component Value Date   CHOL 167 11/01/2018   TRIG 266 (H) 11/01/2018   HDL 41 11/01/2018   CHOLHDL 4.1 11/01/2018   VLDL 53 (H) 11/01/2018   LDLCALC 73 11/01/2018   LDLCALC 108 (H) 10/29/2018    See Psychiatric Specialty Exam and Suicide Risk Assessment completed by Attending Physician prior to discharge.  Discharge destination:  Home  Is patient on multiple antipsychotic therapies at discharge:  No   Has Patient had three or more failed trials of antipsychotic monotherapy by history:  No  Recommended Plan for Multiple Antipsychotic Therapies: NA  Discharge Instructions    Diet - low sodium heart healthy   Complete by:  As directed    Increase activity slowly   Complete by:  As directed      Allergies as of 11/03/2018   No Known Allergies     Medication List    STOP taking these medications   dicyclomine 20 MG tablet Commonly known as:  BENTYL   HYDROcodone-acetaminophen 5-325 MG tablet Commonly known as:  NORCO   ibuprofen 600 MG tablet Commonly known as:  ADVIL,MOTRIN   naproxen 500 MG tablet Commonly known as:  NAPROSYN   ondansetron 4 MG tablet Commonly known as:  ZOFRAN   predniSONE 10 MG tablet Commonly known as:  DELTASONE     TAKE these medications     Indication  mirtazapine 15 MG tablet Commonly known as:  REMERON Take 1 tablet (15 mg total) by mouth at bedtime.  Indication:  Major Depressive Disorder    traZODone 100 MG tablet Commonly known as:  DESYREL Take 1 tablet (100 mg total) by mouth at bedtime as needed for sleep.  Indication:  Trouble Sleeping      Follow-up Information    [facility] Go on 11/06/2018.   Why:  Please meet Unk Pinto, Peer Support Services, on Friday, 11/06/18, at  7:15am.  Please bring photo ID and your hospital discharge paperwork.   Contact information: [address] [place] [address] [phone]           Follow-up recommendations:  Activity:  as tolerated Diet:  regular Other:  keep follow up appointments  Comments:     Signed: Kristine Linea, MD 11/03/2018, 9:16 AM

## 2018-11-03 NOTE — Progress Notes (Signed)
  [facility] Adult Case Management Discharge Plan :  Will you be returning to the same living situation after discharge:  Yes,  with friend At discharge, do you have transportation home?: Yes,  friend Do you have the ability to pay for your medications: No. Medication management clinic referral faxed.   Release of information consent forms completed and in the chart;  Patient's signature needed at discharge.  Patient to Follow up at: Follow-up Information    [facility] Go on 11/06/2018.   Why:  Please meet Unk Pinto, Peer Support Services, on Friday, 11/06/18, at 7:15am.  Please bring photo ID and your hospital discharge paperwork.   Contact information: [address] [place] [address] [phone]           Next level of care provider has access to [facility] Link:no  Safety Planning and Suicide Prevention discussed: No.Pt declined consent.   Have you used any form of tobacco in the last 30 days? (Cigarettes, Smokeless Tobacco, Cigars, and/or Pipes): Yes  Has patient been referred to the Quitline?: Patient refused referral  Patient has been referred for addiction treatment: Yes  Lorri Frederick, LCSW 11/03/2018, 9:47 AM

## 2018-11-03 NOTE — Progress Notes (Signed)
Recreation Therapy Notes   Date: 11/03/2018  Time: 9:30 am  Location: Craft Room  Behavioral response: Appropriate  Intervention Topic: Stress Management  Discussion/Intervention:  Group content on today was focused on stress. The group defined stress and way to cope with stress. Participants expressed how they know when they are stresses out. Individuals described the different ways they have to cope with stress. The group stated reasons why it is important to cope with stress. Patient explained what good stress is and some examples. The group participated in the intervention "Stress Management". Individuals were able to identify and explore thing about stress. Clinical Observations/Feedback:  Patient came to group and defined stress as trying to control something and everything hitting you at once. He identified smoking, working out and driving as things he does to cope with his stress. Participant explained that the world sometimes stresses him out. Individual was social with peers and staff while participating in the intervention. Andrew Brewer LRT/CTRS             [place] 11/03/2018 10:47 AM

## 2018-11-03 NOTE — Plan of Care (Signed)
Pt. Complaint with medications. Pt. Denies si/hi/avh, contracts for safety. Pt. Reports he can remain safe while on the unit. Pt. Participation in unit activities and groups appropriate and more present. Pt. Attends snack and group this evening.    Problem: Health Behavior/Discharge Planning: Goal: Compliance with treatment plan for underlying cause of condition will improve Outcome: Progressing   Problem: Safety: Goal: Periods of time without injury will increase 11/03/2018 0046 by Lenox Ponds, RN Outcome: Progressing 11/03/2018 0016 by Lenox Ponds, RN Outcome: Progressing   Problem: Activity: Goal: Interest or engagement in leisure activities will improve 11/03/2018 0046 by Lenox Ponds, RN Outcome: Progressing 11/03/2018 0016 by Lenox Ponds, RN Outcome: Progressing   Problem: Medication: Goal: Compliance with prescribed medication regimen will improve Outcome: Progressing

## 2018-11-03 NOTE — [facility] Suicide Risk Assessment (Signed)
[facility]   Principal Problem: Severe major depression, single episode, without psychotic features (HCC) Discharge Diagnoses: Principal Problem:   Severe major depression, single episode, without psychotic features (HCC) Active Problems:   Suicidal ideation   Tobacco use disorder   Total Time spent with patient: 20 minutes  Musculoskeletal: Strength & Muscle Tone: within normal limits Gait & Station: normal Patient leans: N/A  Psychiatric Specialty Exam: Review of Systems  Neurological: Negative.   Psychiatric/Behavioral: Negative.   All other systems reviewed and are negative.   Blood pressure (!) 124/97, pulse 90, temperature 97.7 F (36.5 C), temperature source Oral, resp. rate 18, height 5\' 10"  (1.778 m), weight 77.1 kg, SpO2 100 %.Body mass index is 24.39 kg/m.  General Appearance: Casual  Eye Contact::  Good  Speech:  Clear and Coherent409  Volume:  Normal  Mood:  Euthymic  Affect:  Appropriate  Thought Process:  Goal Directed and Descriptions of Associations: Intact  Orientation:  Full (Time, Place, and Person)  Thought Content:  WDL  Suicidal Thoughts:  No  Homicidal Thoughts:  No  Memory:  Immediate;   Fair Recent;   Fair Remote;   Fair  Judgement:  Fair  Insight:  Present  Psychomotor Activity:  Normal  Concentration:  Fair  Recall:  [employer]  Fund of Knowledge:Fair  Language: Fair  Akathisia:  No  Handed:  Right  AIMS (if indicated):     Assets:  Communication Skills Desire for Improvement Financial Resources/Insurance Physical Health Resilience  Sleep:  Number of Hours: 7  Cognition: WNL  ADL's:  Intact   Mental Status Per Nursing Assessment::   On Admission:  Self-harm thoughts  Demographic Factors:  Male, Adolescent or young adult, Divorced or widowed, Caucasian, Low socioeconomic status and Unemployed  Loss Factors: Decrease in vocational status, Loss of significant relationship, Legal issues and Financial  problems/change in socioeconomic status  Historical Factors: Prior suicide attempts and Impulsivity  Risk Reduction Factors:   Responsible for children under 26 years of age and Sense of responsibility to family  Continued Clinical Symptoms:  Depression:   Impulsivity  Cognitive Features That Contribute To Risk:  None    Suicide Risk:  Minimal: No identifiable suicidal ideation.  Patients presenting with no risk factors but with morbid ruminations; may be classified as minimal risk based on the severity of the depressive symptoms    Plan Of Care/Follow-up recommendations:  Activity:  as tolerated Diet:  regular Other:  keep follow up appointments  Kristine LineaJolanta Helix Lafontaine, MD 11/03/2018, 9:12 AM

## 2018-11-03 NOTE — Progress Notes (Signed)
Patient alert and oriented x 4. Ambulates unit with steady gait. Verbally denies SI/HI/AVH and pain. Patient discharged on above date and time. Verbalized understanding the discharge information provided to patient upon discharge. Patient departed unit with discharge paperwork, prescriptions, 7 day supply of medications and personal belongings. Patient discharged to the visitor's entrance. No distress noted.

## 2018-11-03 NOTE — Progress Notes (Signed)
D: Pt denies SI/HI/AVH, contracts for safety. Pt is pleasant and cooperative, but engages minimally during assessments. Pt. Denies depression and anxiety. Pt. Affect mostly flat, but appropriate. Pt. Reports normal mood, but seems depressed. Pt. has no Complaints, just eager to discharge. Patient Interactions appropriate around the unit.   A: Q x 15 minute observation checks were completed for safety. Patient was provided with education, but needs reinforcement. Patient was given/offered medications per orders. Patient  was encourage to attend groups, participate in unit activities and continue with plan of care. Pt. Chart and plans of care reviewed. Pt. Given support and encouragement.   R: Patient is complaint with medication and unit procedures. Pt. Attends snack and groups. Pt. Frequently observed out in the milieu socializing with peer.             Precautionary checks every 15 minutes for safety maintained, room free of safety hazards, patient sustains no injury or falls during this shift. Will endorse care to next shift.

## 2019-05-17 ENCOUNTER — Other Ambulatory Visit: Payer: Self-pay

## 2019-05-17 ENCOUNTER — Emergency Department
Admission: EM | Admit: 2019-05-17 | Discharge: 2019-05-17 | Disposition: A | Discharge status: Home / Self Care | Payer: Self-pay | Attending: Emergency Medicine | Admitting: Emergency Medicine

## 2019-05-17 DIAGNOSIS — Z79899 Other long term (current) drug therapy: Secondary | ICD-10-CM | POA: Insufficient documentation

## 2019-05-17 DIAGNOSIS — L237 Allergic contact dermatitis due to plants, except food: Secondary | ICD-10-CM | POA: Insufficient documentation

## 2019-05-17 DIAGNOSIS — F17210 Nicotine dependence, cigarettes, uncomplicated: Secondary | ICD-10-CM | POA: Insufficient documentation

## 2019-05-17 MED ORDER — TRIAMCINOLONE ACETONIDE 40 MG/ML IJ SUSP
40.0000 mg | Freq: Once | INTRAMUSCULAR | Status: AC
  Administered 2019-05-17: 40 mg via INTRAMUSCULAR
  Filled 2019-05-17: qty 1

## 2019-05-17 MED ORDER — PREDNISONE 10 MG (21) PO TBPK: ORAL_TABLET | ORAL | 0 refills | Status: DC

## 2019-05-17 NOTE — ED Provider Notes (Signed)
[facility] Emergency Department Provider Note  ____________________________________________  Time seen: Approximately 4:07 PM  I have reviewed the triage vital signs and the nursing notes.   HISTORY  Chief Complaint Rash    HPI Andrew Brewer is a 28 y.o. male presents to the emergency department with poison ivy dermatitis that has been going on for the past week.  Patient states that "I look at poison ivy and I get a rash".  Patient states that he has had no shortness of breath, throat tightness, chest tightness, chest pain, vomiting or diarrhea.  He denies a history of anaphylaxis and does not carry an EpiPen for known allergies.  He has been trying calamine lotion at home and states that he does not think his rash is getting any better.  He does have some rash underneath his chin and some involvement of the face.  Poison ivy dermatitis is mostly located along upper and lower extremities.        History reviewed. No pertinent past medical history.  Patient Active Problem List   Diagnosis Date Noted  . Tobacco use disorder 11/02/2018  . Severe major depression, single episode, without psychotic features ([place]) 10/30/2018  . Suicidal ideation 10/30/2018    History reviewed. No pertinent surgical history.  Prior to Admission medications   Medication Sig Start Date End Date Taking? Authorizing Provider  mirtazapine (REMERON) 15 MG tablet Take 1 tablet (15 mg total) by mouth at bedtime. 11/03/18   Pucilowska, Wardell Honour, MD  predniSONE (STERAPRED UNI-PAK 21 TAB) 10 MG (21) TBPK tablet Take 6 tablets the first day, take 5 tablets the second day, take 4 tablets the third day, take 3 tablets the fourth day, take 2 tablets the fifth day, take 1 tablet the sixth day. 05/17/19   Lannie Fields, PA-C  traZODone (DESYREL) 100 MG tablet Take 1 tablet (100 mg total) by mouth at bedtime as needed for sleep. 11/03/18   Pucilowska, Wardell Honour, MD    Allergies Patient  has no known allergies.  No family history on file.  Social History Social History   Tobacco Use  . Smoking status: Current Every Day Smoker    Packs/day: 1.00    Types: Cigarettes  . Smokeless tobacco: Never Used  Substance Use Topics  . Alcohol use: Yes  . Drug use: No     Review of Systems  Constitutional: No fever/chills Eyes: No visual changes. No discharge ENT: No upper respiratory complaints. Cardiovascular: no chest pain. Respiratory: no cough. No SOB. Gastrointestinal: No abdominal pain.  No nausea, no vomiting.  No diarrhea.  No constipation. Genitourinary: Negative for dysuria. No hematuria Musculoskeletal: Negative for musculoskeletal pain. Skin: Patient has poison ivy dermatitis.  Neurological: Negative for headaches, focal weakness or numbness.   ____________________________________________   PHYSICAL EXAM:  VITAL SIGNS: ED Triage Vitals  Enc Vitals Group     BP 05/17/19 1521 131/82     Pulse Rate 05/17/19 1521 95     Resp 05/17/19 1521 16     Temp 05/17/19 1521 99 F (37.2 C)     Temp Source 05/17/19 1521 Oral     SpO2 05/17/19 1521 100 %     Weight 05/17/19 1522 200 lb (90.7 kg)     Height 05/17/19 1522 5\' 10"  (1.778 m)     Head Circumference --      Peak Flow --      Pain Score 05/17/19 1521 0     Pain Loc --  Pain Edu? --      Excl. in GC? --      Constitutional: Alert and oriented. Well appearing and in no acute distress. Eyes: Conjunctivae are normal. PERRL. EOMI. Head: Atraumatic. ENT:      Nose: No congestion/rhinnorhea.      Mouth/Throat: Mucous membranes are moist. Airway is patent.  Cardiovascular: Normal rate, regular rhythm. Normal S1 and S2.  Good peripheral circulation. Respiratory: Normal respiratory effort without tachypnea or retractions. Lungs CTAB. Good air entry to the bases with no decreased or absent breath sounds. Musculoskeletal: Full range of motion to all extremities. No gross deformities  appreciated. Neurologic:  Normal speech and language. No gross focal neurologic deficits are appreciated.  Skin: Patient has poison ivy dermatitis along upper extremities and lower extremities.  Rash is papular and linear in nature without vesicle formation.  It is in an irregular distribution. Psychiatric: Mood and affect are normal. Speech and behavior are normal. Patient exhibits appropriate insight and judgement.   ____________________________________________   LABS (all labs ordered are listed, but only abnormal results are displayed)  Labs Reviewed - No data to display ____________________________________________  EKG   ____________________________________________  RADIOLOGY   No results found.  ____________________________________________    PROCEDURES  Procedure(s) performed:    Procedures    Medications  triamcinolone acetonide (KENALOG-40) injection 40 mg (has no administration in time range)     ____________________________________________   INITIAL IMPRESSION / ASSESSMENT AND PLAN / ED COURSE  Pertinent labs & imaging results that were available during my care of the patient were reviewed by me and considered in my medical decision making (see chart for details).  Review of the [employer] CSRS was performed in accordance of the [facility] prior to dispensing any controlled drugs.           Assessment and Plan:  Poison ivy dermatitis 28 year old male presents to the emergency department with a linear, papular, irregularly distributed, pruritic rash along the upper extremities, lower extremities and face for 1 week after being exposed to poison ivy.  Vital signs were stable in triage.  Patient was resting comfortably in exam room.  Patient received an injection of Kenalog in the emergency department as poison ivy dermatitis is progressively involving face. Patient was discharged with a 7-day course of tapered prednisone.  He was cautioned that rebound rash  can occur with poison ivy dermatitis and an extended taper might be necessary.  Patient was cautioned to return to the emergency department if rebound rash occurs.  He was advised to continue using calamine lotion for comfort.  All patient questions were answered.   ____________________________________________  FINAL CLINICAL IMPRESSION(S) / ED DIAGNOSES  Final diagnoses:  Poison ivy dermatitis      NEW MEDICATIONS STARTED DURING THIS VISIT:  ED Discharge Orders         Ordered    predniSONE (STERAPRED UNI-PAK 21 TAB) 10 MG (21) TBPK tablet     05/17/19 1604              This chart was dictated using voice recognition software/Dragon. Despite best efforts to proofread, errors can occur which can change the meaning. Any change was purely unintentional.    Orvil FeilWoods, Jaclyn M, PA-C 05/17/19 1613    Jeanmarie PlantMcShane, James A, MD 05/17/19 2241

## 2019-05-17 NOTE — ED Triage Notes (Signed)
Reports poison ivy X 1 week, severe itching. Pt alert and oriented X4, active, cooperative, pt in NAD. RR even and unlabored, color WNL.

## 2019-05-17 NOTE — ED Notes (Signed)
See triage note  Presents with possible poison ivy/oak  States he clears  Brush and trees  sxs' started about 1 week ago

## 2019-07-29 ENCOUNTER — Emergency Department (HOSPITAL_COMMUNITY): Payer: Self-pay | Admitting: Certified Registered Nurse Anesthetist

## 2019-07-29 ENCOUNTER — Other Ambulatory Visit: Payer: Self-pay

## 2019-07-29 ENCOUNTER — Encounter (HOSPITAL_COMMUNITY): Payer: Self-pay | Admitting: Certified Registered"

## 2019-07-29 ENCOUNTER — Emergency Department (HOSPITAL_COMMUNITY): Payer: Self-pay

## 2019-07-29 ENCOUNTER — Encounter (HOSPITAL_COMMUNITY)
Admission: EM | Disposition: A | Discharge status: Home / Self Care | Payer: Self-pay | Source: Home / Self Care | Attending: Emergency Medicine

## 2019-07-29 ENCOUNTER — Emergency Department (HOSPITAL_COMMUNITY)
Admission: EM | Admit: 2019-07-29 | Discharge: 2019-07-29 | Disposition: A | Discharge status: Home / Self Care | Payer: Self-pay | Attending: Emergency Medicine | Admitting: Emergency Medicine

## 2019-07-29 DIAGNOSIS — S67190A Crushing injury of right index finger, initial encounter: Secondary | ICD-10-CM | POA: Insufficient documentation

## 2019-07-29 DIAGNOSIS — Z20828 Contact with and (suspected) exposure to other viral communicable diseases: Secondary | ICD-10-CM | POA: Insufficient documentation

## 2019-07-29 DIAGNOSIS — Z79899 Other long term (current) drug therapy: Secondary | ICD-10-CM | POA: Insufficient documentation

## 2019-07-29 DIAGNOSIS — W231XXA Caught, crushed, jammed, or pinched between stationary objects, initial encounter: Secondary | ICD-10-CM | POA: Insufficient documentation

## 2019-07-29 DIAGNOSIS — S62630B Displaced fracture of distal phalanx of right index finger, initial encounter for open fracture: Secondary | ICD-10-CM | POA: Insufficient documentation

## 2019-07-29 DIAGNOSIS — F17210 Nicotine dependence, cigarettes, uncomplicated: Secondary | ICD-10-CM | POA: Insufficient documentation

## 2019-07-29 DIAGNOSIS — F322 Major depressive disorder, single episode, severe without psychotic features: Secondary | ICD-10-CM | POA: Insufficient documentation

## 2019-07-29 DIAGNOSIS — Z23 Encounter for immunization: Secondary | ICD-10-CM | POA: Insufficient documentation

## 2019-07-29 HISTORY — PX: I & D EXTREMITY: SHX5045

## 2019-07-29 LAB — SARS CORONAVIRUS 2 BY RT PCR (HOSPITAL ORDER, PERFORMED IN ~~LOC~~ HOSPITAL LAB): SARS Coronavirus 2: NEGATIVE

## 2019-07-29 SURGERY — IRRIGATION AND DEBRIDEMENT EXTREMITY
Anesthesia: General | Site: Finger | Laterality: Right

## 2019-07-29 MED ORDER — BUPIVACAINE HCL (PF) 0.25 % IJ SOLN
INTRAMUSCULAR | Status: DC | PRN
  Administered 2019-07-29: 10 mL

## 2019-07-29 MED ORDER — OXYCODONE HCL 5 MG PO TABS: 5.0000 mg | ORAL_TABLET | Freq: Once | ORAL | Status: DC | PRN

## 2019-07-29 MED ORDER — BUPIVACAINE HCL (PF) 0.25 % IJ SOLN
INTRAMUSCULAR | Status: AC
  Filled 2019-07-29: qty 30

## 2019-07-29 MED ORDER — HYDROCODONE-ACETAMINOPHEN 5-325 MG PO TABS: ORAL_TABLET | ORAL | 0 refills | Status: DC

## 2019-07-29 MED ORDER — KETOROLAC TROMETHAMINE 30 MG/ML IJ SOLN
INTRAMUSCULAR | Status: AC
  Administered 2019-07-29: 20:00:00 30 mg
  Filled 2019-07-29: qty 1

## 2019-07-29 MED ORDER — SUCCINYLCHOLINE CHLORIDE 20 MG/ML IJ SOLN
INTRAMUSCULAR | Status: DC | PRN
  Administered 2019-07-29: 120 mg via INTRAVENOUS

## 2019-07-29 MED ORDER — SULFAMETHOXAZOLE-TRIMETHOPRIM 800-160 MG PO TABS: 1.0000 | ORAL_TABLET | Freq: Two times a day (BID) | ORAL | 0 refills | Status: DC

## 2019-07-29 MED ORDER — BUPIVACAINE HCL (PF) 0.5 % IJ SOLN
10.0000 mL | Freq: Once | INTRAMUSCULAR | Status: AC
  Administered 2019-07-29: 14:00:00 10 mL
  Filled 2019-07-29: qty 10

## 2019-07-29 MED ORDER — DEXAMETHASONE SODIUM PHOSPHATE 10 MG/ML IJ SOLN
INTRAMUSCULAR | Status: DC | PRN
  Administered 2019-07-29: 10 mg via INTRAVENOUS

## 2019-07-29 MED ORDER — ONDANSETRON HCL 4 MG/2ML IJ SOLN
INTRAMUSCULAR | Status: AC
  Filled 2019-07-29: qty 2

## 2019-07-29 MED ORDER — ACETAMINOPHEN 10 MG/ML IV SOLN: 1000.0000 mg | Freq: Once | INTRAVENOUS | Status: DC | PRN

## 2019-07-29 MED ORDER — KETOROLAC TROMETHAMINE 30 MG/ML IJ SOLN
30.0000 mg | Freq: Once | INTRAMUSCULAR | Status: AC
  Administered 2019-07-29: 20:00:00 30 mg via INTRAVENOUS

## 2019-07-29 MED ORDER — OXYCODONE HCL 5 MG/5ML PO SOLN: 5.0000 mg | Freq: Once | ORAL | Status: DC | PRN

## 2019-07-29 MED ORDER — PROPOFOL 10 MG/ML IV BOLUS
INTRAVENOUS | Status: AC
  Filled 2019-07-29: qty 20

## 2019-07-29 MED ORDER — PROPOFOL 10 MG/ML IV BOLUS
INTRAVENOUS | Status: DC | PRN
  Administered 2019-07-29: 200 mg via INTRAVENOUS

## 2019-07-29 MED ORDER — FENTANYL CITRATE (PF) 250 MCG/5ML IJ SOLN
INTRAMUSCULAR | Status: AC
  Filled 2019-07-29: qty 5

## 2019-07-29 MED ORDER — BUPIVACAINE HCL 0.5 % IJ SOLN
10.0000 mL | Freq: Once | INTRAMUSCULAR | Status: DC
  Filled 2019-07-29: qty 10

## 2019-07-29 MED ORDER — SUGAMMADEX SODIUM 500 MG/5ML IV SOLN
INTRAVENOUS | Status: AC
  Filled 2019-07-29: qty 5

## 2019-07-29 MED ORDER — LIDOCAINE 2% (20 MG/ML) 5 ML SYRINGE
INTRAMUSCULAR | Status: DC | PRN
  Administered 2019-07-29: 60 mg via INTRAVENOUS

## 2019-07-29 MED ORDER — ROCURONIUM BROMIDE 10 MG/ML (PF) SYRINGE
PREFILLED_SYRINGE | INTRAVENOUS | Status: DC | PRN
  Administered 2019-07-29: 20 mg via INTRAVENOUS
  Administered 2019-07-29: 10 mg via INTRAVENOUS

## 2019-07-29 MED ORDER — TETANUS-DIPHTH-ACELL PERTUSSIS 5-2.5-18.5 LF-MCG/0.5 IM SUSP
0.5000 mL | Freq: Once | INTRAMUSCULAR | Status: AC
  Administered 2019-07-29: 13:00:00 0.5 mL via INTRAMUSCULAR
  Filled 2019-07-29: qty 0.5

## 2019-07-29 MED ORDER — SODIUM CHLORIDE 0.9 % IV SOLN
Freq: Once | INTRAVENOUS | Status: AC
  Administered 2019-07-29: 18:00:00 via INTRAVENOUS

## 2019-07-29 MED ORDER — MIDAZOLAM HCL 2 MG/2ML IJ SOLN
INTRAMUSCULAR | Status: AC
  Filled 2019-07-29: qty 2

## 2019-07-29 MED ORDER — SUGAMMADEX SODIUM 200 MG/2ML IV SOLN
INTRAVENOUS | Status: DC | PRN
  Administered 2019-07-29: 200 mg via INTRAVENOUS

## 2019-07-29 MED ORDER — GLYCOPYRROLATE PF 0.2 MG/ML IJ SOSY
PREFILLED_SYRINGE | INTRAMUSCULAR | Status: AC
  Filled 2019-07-29: qty 1

## 2019-07-29 MED ORDER — MIDAZOLAM HCL 2 MG/2ML IJ SOLN
INTRAMUSCULAR | Status: DC | PRN
  Administered 2019-07-29: 2 mg via INTRAVENOUS

## 2019-07-29 MED ORDER — CEFAZOLIN SODIUM-DEXTROSE 2-3 GM-%(50ML) IV SOLR
INTRAVENOUS | Status: DC | PRN
  Administered 2019-07-29: 2 g via INTRAVENOUS

## 2019-07-29 MED ORDER — LACTATED RINGERS IV SOLN
INTRAVENOUS | Status: DC | PRN
  Administered 2019-07-29: 19:00:00 via INTRAVENOUS

## 2019-07-29 MED ORDER — FENTANYL CITRATE (PF) 100 MCG/2ML IJ SOLN
INTRAMUSCULAR | Status: DC | PRN
  Administered 2019-07-29 (×2): 50 ug via INTRAVENOUS
  Administered 2019-07-29: 100 ug via INTRAVENOUS
  Administered 2019-07-29: 50 ug via INTRAVENOUS

## 2019-07-29 MED ORDER — PROMETHAZINE HCL 25 MG/ML IJ SOLN: 6.2500 mg | INTRAMUSCULAR | Status: DC | PRN

## 2019-07-29 MED ORDER — 0.9 % SODIUM CHLORIDE (POUR BTL) OPTIME
TOPICAL | Status: DC | PRN
  Administered 2019-07-29: 1000 mL

## 2019-07-29 MED ORDER — ONDANSETRON HCL 4 MG/2ML IJ SOLN
INTRAMUSCULAR | Status: DC | PRN
  Administered 2019-07-29: 4 mg via INTRAVENOUS

## 2019-07-29 MED ORDER — DEXAMETHASONE SODIUM PHOSPHATE 10 MG/ML IJ SOLN
INTRAMUSCULAR | Status: AC
  Filled 2019-07-29: qty 1

## 2019-07-29 MED ORDER — HYDROMORPHONE HCL 1 MG/ML IJ SOLN: 0.2500 mg | INTRAMUSCULAR | Status: DC | PRN

## 2019-07-29 SURGICAL SUPPLY — 57 items
BNDG CMPR 9X4 STRL LF SNTH (GAUZE/BANDAGES/DRESSINGS)
BNDG COHESIVE 1X5 TAN STRL LF (GAUZE/BANDAGES/DRESSINGS) ×2 IMPLANT
BNDG COHESIVE 2X5 TAN STRL LF (GAUZE/BANDAGES/DRESSINGS) IMPLANT
BNDG ELASTIC 3X5.8 VLCR STR LF (GAUZE/BANDAGES/DRESSINGS) ×3 IMPLANT
BNDG ELASTIC 4X5.8 VLCR STR LF (GAUZE/BANDAGES/DRESSINGS) ×3 IMPLANT
BNDG ESMARK 4X9 LF (GAUZE/BANDAGES/DRESSINGS) IMPLANT
BNDG GAUZE ELAST 4 BULKY (GAUZE/BANDAGES/DRESSINGS) ×3 IMPLANT
CORD BIPOLAR FORCEPS 12FT (ELECTRODE) ×3 IMPLANT
COVER SURGICAL LIGHT HANDLE (MISCELLANEOUS) ×3 IMPLANT
COVER WAND RF STERILE (DRAPES) ×3 IMPLANT
CUFF TOURN SGL QUICK 18X4 (TOURNIQUET CUFF) IMPLANT
CUFF TOURN SGL QUICK 24 (TOURNIQUET CUFF)
CUFF TRNQT CYL 24X4X16.5-23 (TOURNIQUET CUFF) IMPLANT
DECANTER SPIKE VIAL GLASS SM (MISCELLANEOUS) ×3 IMPLANT
DRAIN PENROSE 1/4X12 LTX STRL (WOUND CARE) IMPLANT
DRSG PAD ABDOMINAL 8X10 ST (GAUZE/BANDAGES/DRESSINGS) ×6 IMPLANT
GAUZE SPONGE 4X4 12PLY STRL (GAUZE/BANDAGES/DRESSINGS) ×3 IMPLANT
GAUZE SPONGE 4X4 12PLY STRL LF (GAUZE/BANDAGES/DRESSINGS) ×2 IMPLANT
GAUZE XEROFORM 1X8 LF (GAUZE/BANDAGES/DRESSINGS) ×3 IMPLANT
GAUZE XEROFORM 5X9 LF (GAUZE/BANDAGES/DRESSINGS) ×2 IMPLANT
GLOVE BIO SURGEON STRL SZ7 (GLOVE) ×2 IMPLANT
GLOVE BIO SURGEON STRL SZ7.5 (GLOVE) ×3 IMPLANT
GLOVE BIOGEL PI IND STRL 7.5 (GLOVE) IMPLANT
GLOVE BIOGEL PI IND STRL 8 (GLOVE) ×1 IMPLANT
GLOVE BIOGEL PI INDICATOR 7.5 (GLOVE) ×2
GLOVE BIOGEL PI INDICATOR 8 (GLOVE) ×2
GLOVE INDICATOR 7.5 STRL GRN (GLOVE) ×2 IMPLANT
GOWN STRL REUS W/ TWL LRG LVL3 (GOWN DISPOSABLE) ×1 IMPLANT
GOWN STRL REUS W/ TWL XL LVL3 (GOWN DISPOSABLE) ×1 IMPLANT
GOWN STRL REUS W/TWL LRG LVL3 (GOWN DISPOSABLE) ×3
GOWN STRL REUS W/TWL XL LVL3 (GOWN DISPOSABLE) ×3
KIT BASIN OR (CUSTOM PROCEDURE TRAY) ×3 IMPLANT
KIT TURNOVER KIT B (KITS) ×3 IMPLANT
LOOP VESSEL MAXI BLUE (MISCELLANEOUS) IMPLANT
MANIFOLD NEPTUNE II (INSTRUMENTS) IMPLANT
NDL HYPO 25X1 1.5 SAFETY (NEEDLE) IMPLANT
NEEDLE HYPO 25X1 1.5 SAFETY (NEEDLE) IMPLANT
NS IRRIG 1000ML POUR BTL (IV SOLUTION) ×3 IMPLANT
PACK ORTHO EXTREMITY (CUSTOM PROCEDURE TRAY) ×3 IMPLANT
PAD ARMBOARD 7.5X6 YLW CONV (MISCELLANEOUS) ×6 IMPLANT
SET CYSTO W/LG BORE CLAMP LF (SET/KITS/TRAYS/PACK) IMPLANT
SOL PREP POV-IOD 4OZ 10% (MISCELLANEOUS) ×6 IMPLANT
SPONGE LAP 4X18 RFD (DISPOSABLE) ×3 IMPLANT
SUT CHROMIC 6 0 PS 4 (SUTURE) ×2 IMPLANT
SUT ETHILON 4 0 P 3 18 (SUTURE) IMPLANT
SUT ETHILON 4 0 PS 2 18 (SUTURE) IMPLANT
SUT MON AB 5-0 P3 18 (SUTURE) ×2 IMPLANT
SUT PROLENE 6 0 PC 1 (SUTURE) ×2 IMPLANT
SWAB COLLECTION DEVICE MRSA (MISCELLANEOUS) IMPLANT
SWAB CULTURE ESWAB REG 1ML (MISCELLANEOUS) IMPLANT
SYR CONTROL 10ML LL (SYRINGE) IMPLANT
TOWEL GREEN STERILE (TOWEL DISPOSABLE) ×3 IMPLANT
TUBE CONNECTING 12'X1/4 (SUCTIONS) ×1
TUBE CONNECTING 12X1/4 (SUCTIONS) ×2 IMPLANT
TUBE FEEDING ENTERAL 5FR 16IN (TUBING) IMPLANT
UNDERPAD 30X30 (UNDERPADS AND DIAPERS) ×3 IMPLANT
YANKAUER SUCT BULB TIP NO VENT (SUCTIONS) ×3 IMPLANT

## 2019-07-29 NOTE — Op Note (Signed)
NAME: Andrew Brewer MEDICAL RECORD NO: [id number] DATE OF BIRTH: [birth year] FACILITY: [facility] LOCATION: [facility] OR PHYSICIAN: Tami Ribas, MD   OPERATIVE REPORT   DATE OF PROCEDURE: 07/29/19    PREOPERATIVE DIAGNOSIS:   Right index finger crush injury with open distal phalanx fracture and skin and nailbed lacerations   POSTOPERATIVE DIAGNOSIS:  Right index finger crush injury with open distal phalanx fracture and skin and nailbed lacerations   PROCEDURE:   1.  Right index finger irrigation and debridement of open distal phalanx fracture including tissue deep to fascia 2.  Right index finger open reduction of distal phalanx fracture 3.  Right index finger repair of skin and nailbed lacerations   SURGEON:  Betha Loa, M.D.   ASSISTANT: none   ANESTHESIA:  General   INTRAVENOUS FLUIDS:  Per anesthesia flow sheet.   ESTIMATED BLOOD LOSS:  Minimal.   COMPLICATIONS:  None.   SPECIMENS:  none   TOURNIQUET TIME:    Total Tourniquet Time Documented: Upper Arm (Right) - 33 minutes Total: Upper Arm (Right) - 33 minutes    DISPOSITION:  Stable to PACU.   INDICATIONS: 28 year old right-hand-dominant male states he smashed his right index finger between a piece of wood in his truck earlier today.  He was seen at emergency department where radiographs were taken revealing distal phalanx fracture.  He had skin and nailbed lacerations.  Recommended operative debridement reduction and repair of skin and nailbed lacerations in the operating room. Risks, benefits and alternatives of surgery were discussed including the risks of blood loss, infection, damage to nerves, vessels, tendons, ligaments, bone for surgery, need for additional surgery, complications with wound healing, continued pain, nonunion, malunion, stiffness, nail deformity.  He voiced understanding of these risks and elected to proceed.  OPERATIVE COURSE:  After being identified preoperatively by myself,  the patient and  I agreed on the procedure and site of the procedure.  The surgical site was marked.  Surgical consent had been signed. He was given IV antibiotics as preoperative antibiotic prophylaxis. He was transferred to the operating room and placed on the operating table in supine position with the Right upper extremity on an arm board.  General anesthesia was induced by the anesthesiologist.  Right upper extremity was prepped and draped in normal sterile orthopedic fashion.  A surgical pause was performed between the surgeons, anesthesia, and operating room staff and all were in agreement as to the patient, procedure, and site of procedure.  Tourniquet at the proximal aspect of the extremity was inflated to 250 mmHg after exsanguination of the arm with an Esmarch bandage.    The nail was removed with a freer elevator.  The wound was explored.  There was gross contamination with woody and gritty material.  This was removed with the pickups.  The knife was used to debride the skin subcutaneous tissues and tissue deep to fascia and adjacent to the bone.  Pickups were used to remove bits of foreign matter as well.  The wound was then copiously irrigated with sterile saline.  The distal phalanx fracture was reduced under direct visualization.  5-0 Monocryl suture was used in interrupted fashion to reapproximate the skin edges.  The nailbed laceration was stellate.  This was repaired with a 6-0 chromic suture in an interrupted fashion.  Good reapproximation of soft tissues was obtained.  A piece of Xeroform was placed in the nail fold and the wounds dressed with sterile Xeroform 4 x 4 and wrapped with a  Coban dressing lightly.  An AlumaFoam splint was placed and wrapped lightly with Coban dressing.  Digital block was performed with quarter percent plain Marcaine to aid in postoperative analgesia.  The tourniquet was deflated at 33 minutes.  Fingertips were pink with brisk capillary refill after deflation of tourniquet.  The  operative  drapes were broken down.  The patient was awoken from anesthesia safely.  He was transferred back to the stretcher and taken to PACU in stable condition.  I will see him back in the office in 1 week for postoperative followup.  I will give him a prescription for Norco 5/325 1-2 tabs PO q6 hours prn pain, dispense # 20 and Bactrim DS 1 p.o. twice daily x7 days.   Leanora Cover, MD Electronically signed, 07/29/19

## 2019-07-29 NOTE — H&P (Signed)
Andrew Brewer is an 28 y.o. male.   Chief Complaint: right index crush injury HPI: 28 yo male states he smashed right index finger between a log and his truck earlier today.  Seen at [facility] where XR revealed distal phalanx fracture.  States the finger was throbbing initially but now is not painful.  Pain alleviated by digital block and aggravated by nothing.  There is associated wound of the finger involving the nail.  Had good sensation in finger prior to digital block.  Case discussed with Arlean Hopping, [facility] and his note from 07/29/2019 reviewed. Xrays viewed and interpreted by me: 3 views right index finger show distal phalanx fracture at base of tuft.  No dislocation or radioopaque foreign body.  3 views right long finger show no fracture, dislocation, radioopaque foreign body. Labs reviewed: none  Allergies: No Known Allergies  History reviewed. No pertinent past medical history.  History reviewed. No pertinent surgical history.  Family History: History reviewed. No pertinent family history.  Social History:   reports that he has been smoking cigarettes. He has been smoking about 1.00 pack per day. He has never used smokeless tobacco. He reports current alcohol use. He reports that he does not use drugs.  Medications: Medications Prior to Admission  Medication Sig Dispense Refill  . mirtazapine (REMERON) 15 MG tablet Take 1 tablet (15 mg total) by mouth at bedtime. 30 tablet 1  . predniSONE (STERAPRED UNI-PAK 21 TAB) 10 MG (21) TBPK tablet Take 6 tablets the first day, take 5 tablets the second day, take 4 tablets the third day, take 3 tablets the fourth day, take 2 tablets the fifth day, take 1 tablet the sixth day. 21 tablet 0  . traZODone (DESYREL) 100 MG tablet Take 1 tablet (100 mg total) by mouth at bedtime as needed for sleep. 30 tablet 1    Results for orders placed or performed during the hospital encounter of 07/29/19 (from the past 48 hour(s))  SARS Coronavirus 2 [facility]  order, Performed in [facility] hospital lab) Nasopharyngeal Nasopharyngeal Swab     Status: None   Collection Time: 07/29/19  2:55 PM   Specimen: Nasopharyngeal Swab  Result Value Ref Range   SARS Coronavirus 2 NEGATIVE NEGATIVE    Comment: (NOTE) If result is NEGATIVE SARS-CoV-2 target nucleic acids are NOT DETECTED. The SARS-CoV-2 RNA is generally detectable in upper and lower  respiratory specimens during the acute phase of infection. The lowest  concentration of SARS-CoV-2 viral copies this assay can detect is 250  copies / mL. A negative result does not preclude SARS-CoV-2 infection  and should not be used as the sole basis for treatment or other  patient management decisions.  A negative result may occur with  improper specimen collection / handling, submission of specimen other  than nasopharyngeal swab, presence of viral mutation(s) within the  areas targeted by this assay, and inadequate number of viral copies  (<250 copies / mL). A negative result must be combined with clinical  observations, patient history, and epidemiological information. If result is POSITIVE SARS-CoV-2 target nucleic acids are DETECTED. The SARS-CoV-2 RNA is generally detectable in upper and lower  respiratory specimens dur ing the acute phase of infection.  Positive  results are indicative of active infection with SARS-CoV-2.  Clinical  correlation with patient history and other diagnostic information is  necessary to determine patient infection status.  Positive results do  not rule out bacterial infection or co-infection with other viruses. If result is  PRESUMPTIVE POSTIVE SARS-CoV-2 nucleic acids MAY BE PRESENT.   A presumptive positive result was obtained on the submitted specimen  and confirmed on repeat testing.  While 2019 novel coronavirus  (SARS-CoV-2) nucleic acids may be present in the submitted sample  additional confirmatory testing may be necessary for epidemiological  and / or  clinical management purposes  to differentiate between  SARS-CoV-2 and other Sarbecovirus currently known to infect humans.  If clinically indicated additional testing with an alternate test  methodology [phone]) is advised. The SARS-CoV-2 RNA is generally  detectable in upper and lower respiratory sp ecimens during the acute  phase of infection. The expected result is Negative. Fact Sheet for Patients:  BoilerBrush.com.cy Fact Sheet for Healthcare Providers: https://pope.com/ This test is not yet approved or cleared by the [place] FDA and has been authorized for detection and/or diagnosis of SARS-CoV-2 by FDA under an Emergency Use Authorization (EUA).  This EUA will remain in effect (meaning this test can be used) for the duration of the COVID-19 declaration under Section 564(b)(1) of the Act, 21 U.S.C. section 360bbb-3(b)(1), unless the authorization is terminated or revoked sooner. Performed at [facility], [address], [place], [place] [address]     Dg Finger Index Right  Result Date: 07/29/2019 CLINICAL DATA:  Crush type injury to index and middle fingers of RIGHT hand EXAM: RIGHT INDEX FINGER 2+V COMPARISON:  None FINDINGS: Soft tissue swelling and deformity at distal phalanx RIGHT index finger. Transverse mildly distracted fracture through the distal phalanx of the RIGHT index finger at the base of the tuft. No extension to DIP joint. No additional fracture, dislocation or bone destruction. IMPRESSION: Displaced fracture through distal phalanx RIGHT index finger. Electronically Signed   By: Ulyses Southward M.D.   On: 07/29/2019 13:32   Dg Finger Middle Right  Result Date: 07/29/2019 CLINICAL DATA:  Crush injuries to RIGHT index and middle fingers EXAM: RIGHT MIDDLE FINGER 2+V COMPARISON:  None FINDINGS: Osseous mineralization normal. Joint spaces preserved. No acute fracture, dislocation, or bone destruction. IMPRESSION:  Normal exam. Electronically Signed   By: Ulyses Southward M.D.   On: 07/29/2019 13:32     A comprehensive review of systems was negative. Review of Systems: No fevers, chills, night sweats, chest pain, shortness of breath, nausea, vomiting, diarrhea, constipation, easy bleeding or bruising, headaches, dizziness, vision changes, fainting.   Blood pressure 126/88, pulse 82, temperature 98.4 F (36.9 C), temperature source Oral, resp. rate 15, SpO2 94 %.  General appearance: alert, cooperative and appears stated age Head: Normocephalic, without obvious abnormality, atraumatic Neck: supple, symmetrical, trachea midline Resp: clear to auscultation bilaterally Cardio: regular rate and rhythm Extremities: Intact sensation and capillary refill all digits.  +epl/fpl/io.  Nail avulsed from fold of right index finger.  Able to flex and extend dip joint. Pulses: 2+ and symmetric Skin: Skin color, texture, turgor normal. No rashes or lesions Neurologic: Grossly normal Incision/Wound: as above  Assessment/Plan Right index crush injury with open distal phalanx fracture, skin and nail bed lacerations.  Recommend OR for irrigation and debridement, reduction, repair skin and nail bed lacerations.  Risks, benefits and alternatives of surgery were discussed including risks of blood loss, infection, damage to nerves/vessels/tendons/ligament/bone, failure of surgery, need for additional surgery, complication with wound healing, stiffness, nail deformity.  He voiced understanding of these risks and elected to proceed.    Betha Loa 07/29/2019, 6:42 PM

## 2019-07-29 NOTE — ED Triage Notes (Signed)
PT caught R pointer in between log and truck bed. Nail has been severed and hanging. RN has pt soaking in sterile water. Not up to date on tetanus.

## 2019-07-29 NOTE — Anesthesia Procedure Notes (Signed)
Procedure Name: Intubation Date/Time: 07/29/2019 7:09 PM Performed by: Shirlyn Goltz, CRNA Pre-anesthesia Checklist: Patient identified, Emergency Drugs available, Suction available and Patient being monitored Patient Re-evaluated:Patient Re-evaluated prior to induction Oxygen Delivery Method: Circle system utilized Preoxygenation: Pre-oxygenation with 100% oxygen Induction Type: IV induction and Rapid sequence Laryngoscope Size: Mac and 4 Grade View: Grade I Tube type: Oral Tube size: 7.5 mm Number of attempts: 1 Airway Equipment and Method: Stylet Placement Confirmation: positive ETCO2,  ETT inserted through vocal cords under direct vision and breath sounds checked- equal and bilateral Secured at: 20 cm Tube secured with: Tape Dental Injury: Teeth and Oropharynx as per pre-operative assessment

## 2019-07-29 NOTE — Anesthesia Postprocedure Evaluation (Signed)
Anesthesia Post Note  Patient: Bern Fare  Procedure(s) Performed: IRRIGATION AND DEBRIDEMENT INDEX FINGER AND REPAIR SKIN AND NAILBED LACERATIONS (Right Finger)     Patient location during evaluation: PACU Anesthesia Type: General Level of consciousness: awake and alert Pain management: pain level controlled Vital Signs Assessment: post-procedure vital signs reviewed and stable Respiratory status: spontaneous breathing, nonlabored ventilation, respiratory function stable and patient connected to nasal cannula oxygen Cardiovascular status: blood pressure returned to baseline and stable Postop Assessment: no apparent nausea or vomiting Anesthetic complications: no    Last Vitals:  Vitals:   07/29/19 2031 07/29/19 2046  BP: 135/81 125/75  Pulse: 81 78  Resp: 19 16  Temp:  36.6 C  SpO2: 97% 96%    Last Pain:  Vitals:   07/29/19 2046  TempSrc:   PainSc: 0-No pain                 Samay Delcarlo P Franciso Dierks

## 2019-07-29 NOTE — Anesthesia Preprocedure Evaluation (Addendum)
Anesthesia Evaluation  Patient identified by MRN, date of birth, ID band Patient awake    Reviewed: Allergy & Precautions, NPO status , Patient's Chart, lab work & pertinent test results  Airway Mallampati: II  TM Distance: >3 FB Neck ROM: Full    Dental no notable dental hx.    Pulmonary Current SmokerPatient did not abstain from smoking.,    Pulmonary exam normal breath sounds clear to auscultation       Cardiovascular negative cardio ROS Normal cardiovascular exam Rhythm:Regular Rate:Normal     Neuro/Psych PSYCHIATRIC DISORDERS Depression negative neurological ROS     GI/Hepatic negative GI ROS, Neg liver ROS,   Endo/Other  negative endocrine ROS  Renal/GU negative Renal ROS     Musculoskeletal negative musculoskeletal ROS (+)   Abdominal   Peds  Hematology negative hematology ROS (+)   Anesthesia Other Findings finger injury  Reproductive/Obstetrics                            Anesthesia Physical Anesthesia Plan  ASA: II and emergent  Anesthesia Plan: General   Post-op Pain Management:    Induction: Intravenous and Rapid sequence  PONV Risk Score and Plan: 1 and Ondansetron, Dexamethasone, Midazolam and Treatment may vary due to age or medical condition  Airway Management Planned: Oral ETT  Additional Equipment:   Intra-op Plan:   Post-operative Plan: Extubation in OR  Informed Consent: I have reviewed the patients History and Physical, chart, labs and discussed the procedure including the risks, benefits and alternatives for the proposed anesthesia with the patient or authorized representative who has indicated his/her understanding and acceptance.     Dental advisory given  Plan Discussed with: CRNA  Anesthesia Plan Comments:         Anesthesia Quick Evaluation

## 2019-07-29 NOTE — Transfer of Care (Signed)
Immediate Anesthesia Transfer of Care Note  Patient: Andrew Brewer  Procedure(s) Performed: IRRIGATION AND DEBRIDEMENT INDEX FINGER AND REPAIR SKIN AND NAILBED LACERATIONS (Right Finger)  Patient Location: PACU  Anesthesia Type:General  Level of Consciousness: awake, alert , oriented and patient cooperative  Airway & Oxygen Therapy: Patient Spontanous Breathing and Patient connected to nasal cannula oxygen  Post-op Assessment: Report given to RN and Post -op Vital signs reviewed and stable  Post vital signs: Reviewed and stable  Last Vitals:  Vitals Value Taken Time  BP    Temp    Pulse 96 07/29/19 2017  Resp 27 07/29/19 2017  SpO2 97 % 07/29/19 2017  Vitals shown include unvalidated device data.  Last Pain:  Vitals:   07/29/19 1212  TempSrc:   PainSc: 3          Complications: No apparent anesthesia complications

## 2019-07-29 NOTE — ED Provider Notes (Signed)
[facility] EMERGENCY DEPARTMENT Provider Note   CSN: [id number] Arrival date & time: 07/29/19  1159     History   Chief Complaint Chief Complaint  Patient presents with  . Finger Injury    HPI Andrew Brewer is a 28 y.o. male.     HPI   Andrew Brewer is a 28 y.o. male, with a history of tobacco use, presenting to the ED with right finger injury that occurred shortly prior to arrival.  States his index finger was struck and trapped between a log and the truck.  There was some mild involvement of the right middle finger as well. Patient's pain is throbbing, currently mild, radiating proximally towards the PIP joint of the index finger.  Endorses decreased sensation to the right index fingertip. Patient is a daily cigarette smoker.  He is right-hand dominant.  He is not up-to-date on tetanus. Last food intake was approximately 12 PM today.  Denies hand injury, wrist injury, any other injuries.   No past medical history on file.  Patient Active Problem List   Diagnosis Date Noted  . Tobacco use disorder 11/02/2018  . Severe major depression, single episode, without psychotic features ([place]) 10/30/2018  . Suicidal ideation 10/30/2018    No past surgical history on file.      Home Medications    Prior to Admission medications   Medication Sig Start Date End Date Taking? Authorizing Provider  mirtazapine (REMERON) 15 MG tablet Take 1 tablet (15 mg total) by mouth at bedtime. 11/03/18   Pucilowska, Wardell Honour, MD  predniSONE (STERAPRED UNI-PAK 21 TAB) 10 MG (21) TBPK tablet Take 6 tablets the first day, take 5 tablets the second day, take 4 tablets the third day, take 3 tablets the fourth day, take 2 tablets the fifth day, take 1 tablet the sixth day. 05/17/19   Lannie Fields, PA-C  traZODone (DESYREL) 100 MG tablet Take 1 tablet (100 mg total) by mouth at bedtime as needed for sleep. 11/03/18   Pucilowska, Wardell Honour, MD    Family History No  family history on file.  Social History Social History   Tobacco Use  . Smoking status: Current Every Day Smoker    Packs/day: 1.00    Types: Cigarettes  . Smokeless tobacco: Never Used  Substance Use Topics  . Alcohol use: Yes  . Drug use: No     Allergies   Patient has no known allergies.   Review of Systems Review of Systems  Musculoskeletal: Positive for arthralgias.  Skin: Positive for wound. Negative for color change.  Neurological: Positive for numbness.  All other systems reviewed and are negative.    Physical Exam Updated Vital Signs BP 126/88   Pulse 82   Temp 98.4 F (36.9 C) (Oral)   Resp 15   SpO2 94%   Physical Exam Vitals signs and nursing note reviewed.  Constitutional:      General: He is not in acute distress.    Appearance: He is well-developed. He is not diaphoretic.  HENT:     Head: Normocephalic and atraumatic.  Eyes:     Conjunctiva/sclera: Conjunctivae normal.  Neck:     Musculoskeletal: Neck supple.  Cardiovascular:     Rate and Rhythm: Normal rate and regular rhythm.  Pulmonary:     Effort: Pulmonary effort is normal.  Musculoskeletal:     Comments: Approximately 2 cm laceration through the dorsal side of the distal right index finger.  Laceration is proximal  to the nail and the proximal nail is exposed from under the skin. There is also what appears to be bone exposure in the wound.  Hemorrhage is controlled. The palmar side of the finger appears to be intact and capillary refill is intact to the palmar side of the finger. Full range of motion at the PIP and MCP joints of the index finger.  There is a very small, superficial abrasion to the radial, distal right middle finger with mild tenderness.  Full range of motion without difficulty or pain in the DIP, PIP, and MCP joints of the middle finger. Full range of motion without difficulty or pain in the joints of the other fingers, hand, and wrist.  Skin:    General: Skin is warm  and dry.     Capillary Refill: Capillary refill takes less than 2 seconds.     Coloration: Skin is not pale.  Neurological:     Mental Status: He is alert.     Comments: Decreased sensation to light touch in the distal right index finger down to about the level of the PIP joint. Flexion and extension against resistance is weakened at the DIP joint of the index finger. Flexion and extension intact against resistance at the PIP and MCP joints of the index finger. Flexion and extension intact against resistance in each of the other fingers of the right hand.  Psychiatric:        Behavior: Behavior normal.          ED Treatments / Results  Labs (all labs ordered are listed, but only abnormal results are displayed) Labs Reviewed  SARS CORONAVIRUS 2 (HOSPITAL ORDER, PERFORMED IN [facility] LAB)    EKG None  Radiology Dg Finger Index Right  Result Date: 07/29/2019 CLINICAL DATA:  Crush type injury to index and middle fingers of RIGHT hand EXAM: RIGHT INDEX FINGER 2+V COMPARISON:  None FINDINGS: Soft tissue swelling and deformity at distal phalanx RIGHT index finger. Transverse mildly distracted fracture through the distal phalanx of the RIGHT index finger at the base of the tuft. No extension to DIP joint. No additional fracture, dislocation or bone destruction. IMPRESSION: Displaced fracture through distal phalanx RIGHT index finger. Electronically Signed   By: Ulyses Southward M.D.   On: 07/29/2019 13:32   Dg Finger Middle Right  Result Date: 07/29/2019 CLINICAL DATA:  Crush injuries to RIGHT index and middle fingers EXAM: RIGHT MIDDLE FINGER 2+V COMPARISON:  None FINDINGS: Osseous mineralization normal. Joint spaces preserved. No acute fracture, dislocation, or bone destruction. IMPRESSION: Normal exam. Electronically Signed   By: Ulyses Southward M.D.   On: 07/29/2019 13:32    Procedures .Nerve Block  Date/Time: 07/29/2019 1:41 PM Performed by: Anselm Pancoast, PA-C Authorized  by: Anselm Pancoast, PA-C   Consent:    Consent obtained:  Verbal   Consent given by:  Patient Indications:    Indications:  Pain relief and procedural anesthesia Location:    Body area:  Upper extremity   Upper extremity nerve blocked: Index finger, digital.   Laterality:  Right Pre-procedure details:    Skin preparation:  Povidone-iodine Procedure details (see MAR for exact dosages):    Block needle gauge:  25 G   Anesthetic injected:  Bupivacaine 0.5% w/o epi   Injection procedure:  Anatomic landmarks identified, anatomic landmarks palpated, incremental injection, introduced needle and negative aspiration for blood Post-procedure details:    Outcome:  Anesthesia achieved   Patient tolerance of procedure:  Tolerated well,  no immediate complications   (including critical care time)  Medications Ordered in ED Medications  Tdap (BOOSTRIX) injection 0.5 mL (0.5 mLs Intramuscular Given 07/29/19 1308)  bupivacaine (MARCAINE) 0.5 % injection 10 mL (10 mLs Infiltration Given by Other 07/29/19 1400)  0.9 %  sodium chloride infusion ( Intravenous New Bag/Given 07/29/19 1730)  bupivacaine (PF) (MARCAINE) 0.25 % injection (has no administration in time range)  propofol (DIPRIVAN) 10 mg/mL bolus/IV push (has no administration in time range)  midazolam (VERSED) 2 MG/2ML injection (has no administration in time range)  fentaNYL (SUBLIMAZE) 250 MCG/5ML injection (has no administration in time range)     Initial Impression / Assessment and Plan / ED Course  I have reviewed the triage vital signs and the nursing notes.  Pertinent labs & imaging results that were available during my care of the patient were reviewed by me and considered in my medical decision making (see chart for details).  Clinical Course as of Jul 29 1827  Thu Jul 29, 2019  1401 Spoke with Larita FifeLynn, nurse for Dr. Merlyn LotKuzma, hand surgeon. States she will call back or have Dr. Merlyn LotKuzma call directly.   [SJ]  1451 Spoke with Dr. Merlyn LotKuzma,  hand surgeon. States he will take the patient to the OR around 6 PM tonight.  Patient will need to stay n.p.o.Will need COVID testing, but no other labs are necessary.   [SJ]    Clinical Course User Index [SJ] Rhiannan Kievit C, PA-C       Patient presents with injury to the right index finger.  Fracture with overlying open wound.  No signs of vascular compromise.  Patient taken to the OR for washout and repair.   Findings and plan of care discussed with Melene Planan Floyd, DO.   Final Clinical Impressions(s) / ED Diagnoses   Final diagnoses:  Open displaced fracture of distal phalanx of right index finger, initial encounter    ED Discharge Orders    None       Anselm PancoastJoy, Camyla Camposano C, PA-C 07/29/19 1828    Melene PlanFloyd, Dan, DO 07/30/19 0700

## 2019-07-29 NOTE — Discharge Instructions (Addendum)

## 2019-07-30 ENCOUNTER — Encounter (HOSPITAL_COMMUNITY): Payer: Self-pay | Admitting: Orthopedic Surgery

## 2019-12-31 IMAGING — CR DG FINGER INDEX 2+V*R*
3 series · 3 of 3 positions shown · non-contrast
Comparison: None

CLINICAL DATA: Crush type injury to index and middle fingers of
RIGHT hand

EXAM:
RIGHT INDEX FINGER 2+V

[finger ap]
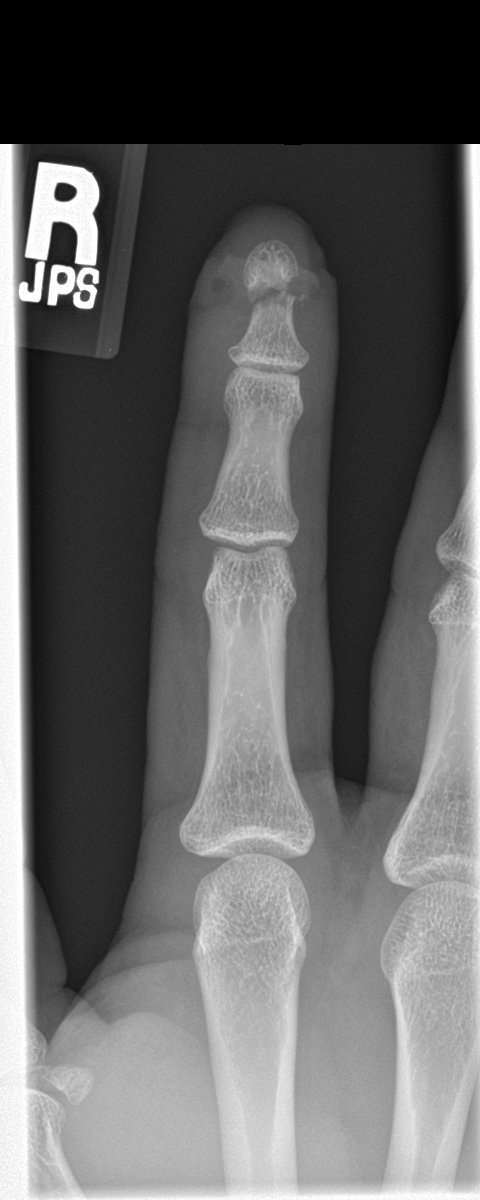

[finger obl]
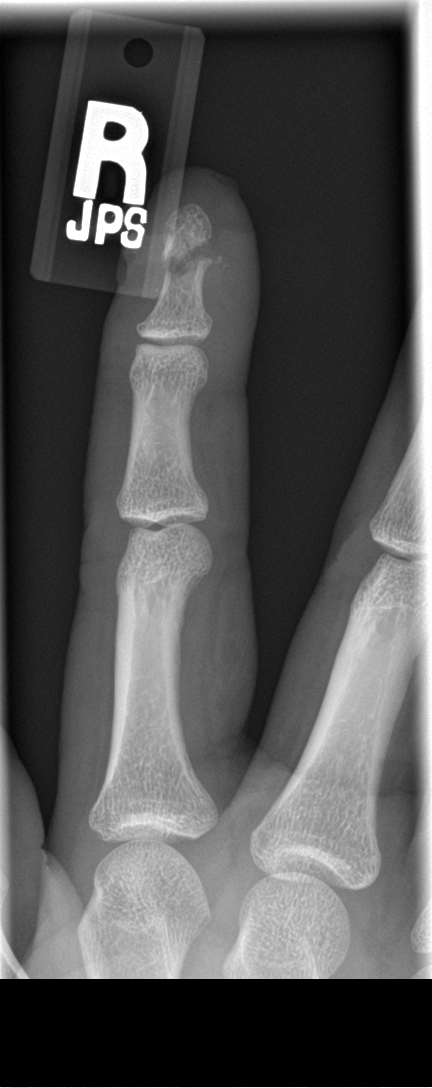

[finger lat]
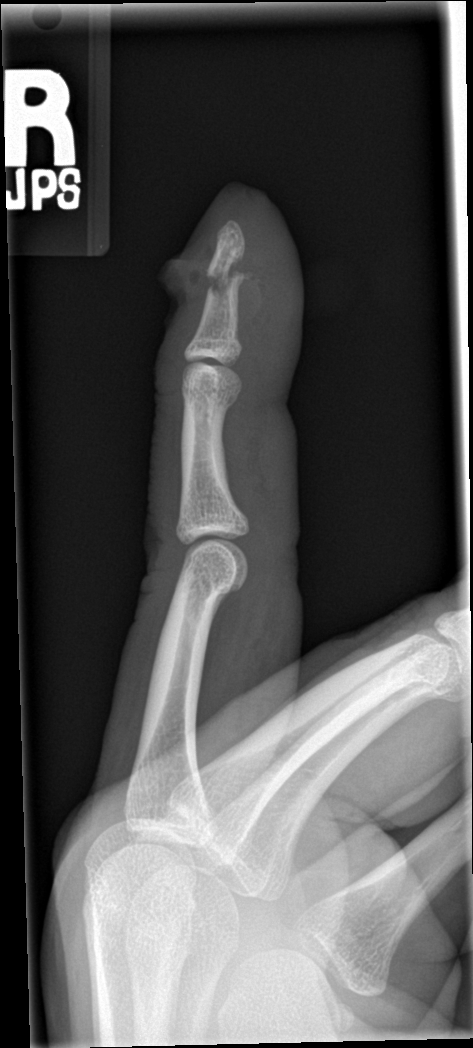

[3 of 3 positions shown; findings below may reference images not displayed]

FINDINGS: Soft tissue swelling and deformity at distal phalanx RIGHT index
finger.

Transverse mildly distracted fracture through the distal phalanx of
the RIGHT index finger at the base of the tuft.

No extension to DIP joint.

No additional fracture, dislocation or bone destruction.
IMPRESSION: Displaced fracture through distal phalanx RIGHT index finger.

## 2020-01-22 ENCOUNTER — Other Ambulatory Visit: Payer: Self-pay

## 2020-01-22 ENCOUNTER — Emergency Department
Admission: EM | Admit: 2020-01-22 | Discharge: 2020-01-22 | Disposition: A | Discharge status: Home / Self Care | Payer: Self-pay | Attending: Emergency Medicine | Admitting: Emergency Medicine

## 2020-01-22 DIAGNOSIS — L237 Allergic contact dermatitis due to plants, except food: Secondary | ICD-10-CM | POA: Insufficient documentation

## 2020-01-22 DIAGNOSIS — F17210 Nicotine dependence, cigarettes, uncomplicated: Secondary | ICD-10-CM | POA: Insufficient documentation

## 2020-01-22 DIAGNOSIS — Z79899 Other long term (current) drug therapy: Secondary | ICD-10-CM | POA: Insufficient documentation

## 2020-01-22 MED ORDER — METHYLPREDNISOLONE 4 MG PO TBPK: ORAL_TABLET | ORAL | 0 refills | Status: DC

## 2020-01-22 MED ORDER — HYDROXYZINE HCL 50 MG PO TABS
50.0000 mg | ORAL_TABLET | Freq: Once | ORAL | Status: AC
  Administered 2020-01-22: 11:00:00 50 mg via ORAL
  Filled 2020-01-22: qty 1

## 2020-01-22 MED ORDER — HYDROXYZINE HCL 50 MG PO TABS: 50.0000 mg | ORAL_TABLET | Freq: Three times a day (TID) | ORAL | 0 refills | Status: DC | PRN

## 2020-01-22 MED ORDER — DEXAMETHASONE SODIUM PHOSPHATE 10 MG/ML IJ SOLN
10.0000 mg | Freq: Once | INTRAMUSCULAR | Status: AC
  Administered 2020-01-22: 11:00:00 10 mg via INTRAMUSCULAR
  Filled 2020-01-22: qty 1

## 2020-01-22 NOTE — ED Triage Notes (Signed)
Pt states red itchy rash to arms, trunk, and face. Pt also c/o facial swelling, noted to upper face/R eye. Airway intact. No oral swelling. Thinks it could be poison oak/sumac. 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 Took ibu last night and a prednisone this AM.

## 2020-01-22 NOTE — Discharge Instructions (Addendum)
Follow discharge care instruction take medication as directed. °

## 2020-01-22 NOTE — ED Notes (Signed)
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  Pt states he has been taking ibuprofen and took prednisone yesterday that he had left over from previous visit.   No swelling to lips or tongue no difficulty breathing.

## 2020-01-22 NOTE — ED Notes (Signed)
First Nurse Note: Pt states that he has poison oak on his face, for the last 24 hours has been having facial swelling on the left side. Pt is in NAD.

## 2020-01-22 NOTE — ED Provider Notes (Signed)
[facility] Emergency Department Provider Note   ____________________________________________   First MD Initiated Contact with Patient 01/22/20 1035     (approximate)  I have reviewed the triage vital signs and the nursing notes.   HISTORY  Chief Complaint Rash and Facial Swelling    HPI Andrew Brewer is a 29 y.o. male patient complains of 2 days of facial swelling secondary to contact with suspected sumac. Patient works for a [employer] states this happened every season. Denies dyspnea or any other anaphylactic signs symptoms. Patient state mild transient relief of her leftover prednisone tablet and Benadryl yesterday. Awakened today with increased facial swelling and itching. Patient also has physical lesions to the bilateral upper extremities.         History reviewed. No pertinent past medical history.  Patient Active Problem List   Diagnosis Date Noted  . Tobacco use disorder 11/02/2018  . Severe major depression, single episode, without psychotic features ([place]) 10/30/2018  . Suicidal ideation 10/30/2018    Past Surgical History:  Procedure Laterality Date  . I & D EXTREMITY Right 07/29/2019   Procedure: IRRIGATION AND DEBRIDEMENT INDEX FINGER AND REPAIR SKIN AND NAILBED LACERATIONS;  Surgeon: Leanora Cover, MD;  Location: [facility];  Service: Orthopedics;  Laterality: Right;    Prior to Admission medications   Medication Sig Start Date End Date Taking? Authorizing Provider  HYDROcodone-acetaminophen (NORCO) 5-325 MG tablet 1-2 tabs po q6 hours prn pain 07/29/19   Leanora Cover, MD  hydrOXYzine (ATARAX/VISTARIL) 50 MG tablet Take 1 tablet (50 mg total) by mouth 3 (three) times daily as needed. 01/22/20   Sable Feil, PA-C  methylPREDNISolone (MEDROL DOSEPAK) 4 MG TBPK tablet Take Tapered dose as directed 01/22/20   Sable Feil, PA-C  sulfamethoxazole-trimethoprim (BACTRIM DS) 800-160 MG tablet Take 1 tablet by mouth 2 (two) times  daily. 07/29/19   Leanora Cover, MD    Allergies Patient has no known allergies.  History reviewed. No pertinent family history.  Social History Social History   Tobacco Use  . Smoking status: Current Every Day Smoker    Packs/day: 1.00    Types: Cigarettes  . Smokeless tobacco: Never Used  Substance Use Topics  . Alcohol use: Yes  . Drug use: No    Review of Systems Constitutional: No fever/chills Eyes: No visual changes. ENT: No sore throat. Cardiovascular: Denies chest pain. Respiratory: Denies shortness of breath. Gastrointestinal: No abdominal pain.  No nausea, no vomiting.  No diarrhea.  No constipation. Genitourinary: Negative for dysuria. Musculoskeletal: Negative for back pain. Skin: Positive for rash. Neurological: Negative for headaches, focal weakness or numbness.   ____________________________________________   PHYSICAL EXAM:  VITAL SIGNS: ED Triage Vitals  Enc Vitals Group     BP 01/22/20 1024 (!) 138/103     Pulse Rate 01/22/20 1024 73     Resp 01/22/20 1024 16     Temp 01/22/20 1024 98.5 F (36.9 C)     Temp Source 01/22/20 1024 Oral     SpO2 01/22/20 1024 98 %     Weight 01/22/20 1025 210 lb (95.3 kg)     Height 01/22/20 1025 5\' 10"  (1.778 m)     Head Circumference --      Peak Flow --      Pain Score 01/22/20 1025 0     Pain Loc --      Pain Edu? --      Excl. in [place]? --     Constitutional:  Alert and oriented. Well appearing and in no acute distress. Eyes: Conjunctivae are normal. PERRL. EOMI. Head: Atraumatic. Nose: No congestion/rhinnorhea. Mouth/Throat: Mucous membranes are moist.  Oropharynx non-erythematous. Neck: No stridor.   Hematological/Lymphatic/Immunilogical: No cervical lymphadenopathy. Cardiovascular: Normal rate, regular rhythm. Grossly normal heart sounds.  Good peripheral circulation. Respiratory: Normal respiratory effort.  No retractions. Lungs CTAB. Skin:  Skin is warm, dry and intact. Facial edema, erythema and  vesicular lesions. Psychiatric: Mood and affect are normal. Speech and behavior are normal.  ____________________________________________   LABS (all labs ordered are listed, but only abnormal results are displayed)  Labs Reviewed - No data to display ____________________________________________  EKG   ____________________________________________  RADIOLOGY  ED MD interpretation:    Official radiology report(s): No results found.  ____________________________________________   PROCEDURES  Procedure(s) performed (including Critical Care):  Procedures   ____________________________________________   INITIAL IMPRESSION / ASSESSMENT AND PLAN / ED COURSE  As part of my medical decision making, I reviewed the following data within the electronic MEDICAL RECORD NUMBER     Patient presents with facial edema. Patient also has bilateral upper extremity vesicular lesions consistent with contact dermatitis. Patient given Decadron Atarax prior to departure. Patient given discharge care instructions and a prescription for Medrol Dosepak and Atarax. Return back to ED if condition worsens.    [name] was evaluated in Emergency Department on 01/22/2020 for the symptoms described in the history of present illness. He was evaluated in the context of the global COVID-19 pandemic, which necessitated consideration that the patient might be at risk for infection with the SARS-CoV-2 virus that causes COVID-19. Institutional protocols and algorithms that pertain to the evaluation of patients at risk for COVID-19 are in a state of rapid change based on information released by regulatory bodies including the CDC and federal and state organizations. These policies and algorithms were followed during the patient's care in the ED.       ____________________________________________   FINAL CLINICAL IMPRESSION(S) / ED DIAGNOSES  Final diagnoses:  Allergic contact dermatitis due to  plants, except food     ED Discharge Orders         Ordered    methylPREDNISolone (MEDROL DOSEPAK) 4 MG TBPK tablet     01/22/20 1044    hydrOXYzine (ATARAX/VISTARIL) 50 MG tablet  3 times daily PRN     01/22/20 1044           Note:  This document was prepared using Dragon voice recognition software and may include unintentional dictation errors.    Jaimes, Eckert, PA-C 01/22/20 1045    Jene Every, MD 01/22/20 1149

## 2020-02-07 ENCOUNTER — Encounter: Payer: Self-pay | Admitting: Emergency Medicine

## 2020-02-07 ENCOUNTER — Emergency Department
Admission: EM | Admit: 2020-02-07 | Discharge: 2020-02-07 | Disposition: A | Discharge status: Home / Self Care | Payer: Self-pay | Attending: Emergency Medicine | Admitting: Emergency Medicine

## 2020-02-07 ENCOUNTER — Other Ambulatory Visit: Payer: Self-pay

## 2020-02-07 DIAGNOSIS — F17210 Nicotine dependence, cigarettes, uncomplicated: Secondary | ICD-10-CM | POA: Insufficient documentation

## 2020-02-07 DIAGNOSIS — T1591XA Foreign body on external eye, part unspecified, right eye, initial encounter: Secondary | ICD-10-CM

## 2020-02-07 DIAGNOSIS — X58XXXA Exposure to other specified factors, initial encounter: Secondary | ICD-10-CM | POA: Insufficient documentation

## 2020-02-07 DIAGNOSIS — Y999 Unspecified external cause status: Secondary | ICD-10-CM | POA: Insufficient documentation

## 2020-02-07 DIAGNOSIS — S0501XA Injury of conjunctiva and corneal abrasion without foreign body, right eye, initial encounter: Secondary | ICD-10-CM

## 2020-02-07 DIAGNOSIS — Y9389 Activity, other specified: Secondary | ICD-10-CM | POA: Insufficient documentation

## 2020-02-07 DIAGNOSIS — Y929 Unspecified place or not applicable: Secondary | ICD-10-CM | POA: Insufficient documentation

## 2020-02-07 DIAGNOSIS — T1581XA Foreign body in other and multiple parts of external eye, right eye, initial encounter: Secondary | ICD-10-CM | POA: Insufficient documentation

## 2020-02-07 MED ORDER — KETOROLAC TROMETHAMINE 0.5 % OP SOLN: 1.0000 [drp] | Freq: Four times a day (QID) | OPHTHALMIC | 0 refills | Status: DC

## 2020-02-07 MED ORDER — TETRACAINE HCL 0.5 % OP SOLN
2.0000 [drp] | Freq: Once | OPHTHALMIC | Status: AC
  Administered 2020-02-07: 2 [drp] via OPHTHALMIC
  Filled 2020-02-07: qty 4

## 2020-02-07 MED ORDER — ERYTHROMYCIN 5 MG/GM OP OINT
TOPICAL_OINTMENT | Freq: Once | OPHTHALMIC | Status: AC
  Administered 2020-02-07: 1 via OPHTHALMIC
  Filled 2020-02-07: qty 1

## 2020-02-07 MED ORDER — HYDROCODONE-ACETAMINOPHEN 5-325 MG PO TABS
1.0000 | ORAL_TABLET | Freq: Once | ORAL | Status: AC
  Administered 2020-02-07: 1 via ORAL
  Filled 2020-02-07: qty 1

## 2020-02-07 MED ORDER — FLUORESCEIN SODIUM 1 MG OP STRP
1.0000 | ORAL_STRIP | Freq: Once | OPHTHALMIC | Status: DC
  Filled 2020-02-07: qty 1

## 2020-02-07 MED ORDER — ERYTHROMYCIN 5 MG/GM OP OINT: 1.0000 "application " | TOPICAL_OINTMENT | Freq: Four times a day (QID) | OPHTHALMIC | 0 refills | Status: AC

## 2020-02-07 NOTE — ED Triage Notes (Signed)
C/O FB to right eye.  States was working and chipping wood when felt FB to eye.  STates occurred 2 hours ago.

## 2020-02-07 NOTE — Discharge Instructions (Signed)
Please follow-up with ophthalmology for symptoms that are not improving over the next 2 to 3 days. Return to the emergency department for symptoms that change or worsen if you are unable to schedule an appointment. Use the medications as prescribed and until finished.

## 2020-02-07 NOTE — ED Notes (Signed)
See triage note  Presents with possible f/b in left eye  States he was using a wood chipper abotu 2 hours ago

## 2020-02-07 NOTE — ED Provider Notes (Signed)
[facility] Emergency Department Provider Note ____________________________________________  Time seen: Approximately 5:55 PM  I have reviewed the triage vital signs and the nursing notes.   HISTORY  Chief Complaint Eye Problem   HPI Andrew Brewer is a 29 y.o. male who presents to the emergency department for evaluation of right arm pain.  He was using a wood chipper a couple hours ago and felt sawdust fly into his eye.  He states he removed 4 pieces of wood and still feels like something is in his eye.  He has attempted rinsing the eye but he continues to have foreign body sensation.   History reviewed. No pertinent past medical history.  Patient Active Problem List   Diagnosis Date Noted  . Tobacco use disorder 11/02/2018  . Severe major depression, single episode, without psychotic features ([facility]) 10/30/2018  . Suicidal ideation 10/30/2018    Past Surgical History:  Procedure Laterality Date  . I & D EXTREMITY Right 07/29/2019   Procedure: IRRIGATION AND DEBRIDEMENT INDEX FINGER AND REPAIR SKIN AND NAILBED LACERATIONS;  Surgeon: Betha Loa, MD;  Location: [facility] OR;  Service: Orthopedics;  Laterality: Right;    Prior to Admission medications   Medication Sig Start Date End Date Taking? Authorizing Provider  erythromycin ophthalmic ointment Place 1 application into the right eye 4 (four) times daily for 7 days. 02/07/20 02/14/20  Dimitra Woodstock, Kasandra Knudsen, FNP  ketorolac (ACULAR) 0.5 % ophthalmic solution Place 1 drop into the right eye 4 (four) times daily. 02/07/20   Chinita Pester, FNP    Allergies Patient has no known allergies.  No family history on file.  Social History Social History   Tobacco Use  . Smoking status: Current Every Day Smoker    Packs/day: 1.00    Types: Cigarettes  . Smokeless tobacco: Never Used  Substance Use Topics  . Alcohol use: Yes  . Drug use: No    Review of Systems   Constitutional: No fever/chills Eyes:  Negative for visual changes.  Positive for pain.  Negative for drainage. Musculoskeletal: Negative for pain. Skin: Negative for rash. Neurological: Negative for headaches, focal weakness or numbness. Allergic: Negative for seasonal allergies. ____________________________________________  PHYSICAL EXAM:  VITAL SIGNS: ED Triage Vitals  Enc Vitals Group     BP 02/07/20 1539 (!) 142/78     Pulse Rate 02/07/20 1539 64     Resp 02/07/20 1539 16     Temp 02/07/20 1539 99 F (37.2 C)     Temp Source 02/07/20 1539 Oral     SpO2 02/07/20 1539 98 %     Weight 02/07/20 1537 210 lb 1.6 oz (95.3 kg)     Height 02/07/20 1537 5\' 10"  (1.778 m)     Head Circumference --      Peak Flow --      Pain Score 02/07/20 1537 0     Pain Loc --      Pain Edu? --      Excl. in [place]? --     Constitutional: Alert and oriented. Well appearing and in no acute distress. Eyes: Visual acuity--see nursing documentation; no globe trauma; Eyelids normal to inspection; Sclera appears anicteric.  Eyelids were inverted. Conjunctiva appears erythematous; Cornea abrasion at the 11 o'clock position approximately 3 mm in length. Head: Atraumatic. Nose: No congestion/rhinnorhea. Mouth/Throat: Mucous membranes are moist.  Oropharynx non-erythematous. Respiratory: Respirations even and unlabored. Breath sounds clear to auscultation. Musculoskeletal:Normal ROM x 4 extremities. Neurologic:  Normal speech and language. No  gross focal neurologic deficits are appreciated. Speech is normal. No gait instability. Skin:  Skin is warm, dry and intact. No rash noted. Psychiatric: Mood and affect are normal. Speech and behavior are normal.  ____________________________________________   LABS (all labs ordered are listed, but only abnormal results are displayed)  Labs Reviewed - No data to display ____________________________________________  EKG  Not indicated ____________________________________________  RADIOLOGY  Not  indicated ____________________________________________   PROCEDURES  Procedure(s) performed: Fluorescein stain exam.  Sterile Q-tip used to remove retained sawdust from upper eyelid. ____________________________________________   INITIAL IMPRESSION / ASSESSMENT AND PLAN / ED COURSE  29 year old male presenting to the emergency department for treatment and evaluation after he got some sawdust in his eye couple hours ago.  Fluorescein stain exam does show corneal abrasion.  Eversion of the right eyelid shows a foreign body that is presumed to be sawdust.  This was removed with a sterile Q-tip.  He will be treated with erythromycin ointment and Acular.  Patient states that because of the eye pain, he currently has a headache.  He will receive a Norco while here.  Referral for ophthalmology was provided.  He is to call and schedule an appointment if he is not improving over the next 2 to 3 days.  For symptoms of change or worsen if he is unable to see ophthalmology, he is to return to the emergency department.  Pertinent labs & imaging results that were available during my care of the patient were reviewed by me and considered in my medical decision making (see chart for details). ____________________________________________   FINAL CLINICAL IMPRESSION(S) / ED DIAGNOSES  Final diagnoses:  Abrasion of right cornea, initial encounter  Foreign body, eye, right, initial encounter    Note:  This document was prepared using Dragon voice recognition software and may include unintentional dictation errors.    Victorino Dike, FNP 02/07/20 1759    Lavonia Drafts, MD 02/07/20 [phone]

## 2021-06-05 ENCOUNTER — Emergency Department: Payer: Self-pay

## 2021-06-05 ENCOUNTER — Other Ambulatory Visit: Payer: Self-pay

## 2021-06-05 ENCOUNTER — Encounter: Payer: Self-pay | Admitting: Emergency Medicine

## 2021-06-05 ENCOUNTER — Emergency Department
Admission: EM | Admit: 2021-06-05 | Discharge: 2021-06-05 | Disposition: A | Discharge status: Home / Self Care | Payer: Self-pay | Attending: Emergency Medicine | Admitting: Emergency Medicine

## 2021-06-05 DIAGNOSIS — S60511A Abrasion of right hand, initial encounter: Secondary | ICD-10-CM | POA: Insufficient documentation

## 2021-06-05 DIAGNOSIS — S80212A Abrasion, left knee, initial encounter: Secondary | ICD-10-CM | POA: Insufficient documentation

## 2021-06-05 DIAGNOSIS — S0101XA Laceration without foreign body of scalp, initial encounter: Secondary | ICD-10-CM | POA: Insufficient documentation

## 2021-06-05 DIAGNOSIS — S80211A Abrasion, right knee, initial encounter: Secondary | ICD-10-CM | POA: Insufficient documentation

## 2021-06-05 DIAGNOSIS — F17210 Nicotine dependence, cigarettes, uncomplicated: Secondary | ICD-10-CM | POA: Insufficient documentation

## 2021-06-05 DIAGNOSIS — Y92410 Unspecified street and highway as the place of occurrence of the external cause: Secondary | ICD-10-CM | POA: Insufficient documentation

## 2021-06-05 DIAGNOSIS — S60512A Abrasion of left hand, initial encounter: Secondary | ICD-10-CM | POA: Insufficient documentation

## 2021-06-05 DIAGNOSIS — R0789 Other chest pain: Secondary | ICD-10-CM | POA: Insufficient documentation

## 2021-06-05 LAB — CBC WITH DIFFERENTIAL/PLATELET
Abs Immature Granulocytes: 0.01 10*3/uL (ref 0.00–0.07)
Basophils Absolute: 0 10*3/uL (ref 0.0–0.1)
Basophils Relative: 0 %
Eosinophils Absolute: 0.2 10*3/uL (ref 0.0–0.5)
Eosinophils Relative: 3 %
HCT: 42.8 % (ref 39.0–52.0)
Hemoglobin: 14.8 g/dL (ref 13.0–17.0)
Immature Granulocytes: 0 %
Lymphocytes Relative: 47 %
Lymphs Abs: 2.6 10*3/uL (ref 0.7–4.0)
MCH: 30.5 pg (ref 26.0–34.0)
MCHC: 34.6 g/dL (ref 30.0–36.0)
MCV: 88.1 fL (ref 80.0–100.0)
Monocytes Absolute: 0.3 10*3/uL (ref 0.1–1.0)
Monocytes Relative: 6 %
Neutro Abs: 2.4 10*3/uL (ref 1.7–7.7)
Neutrophils Relative %: 44 %
Platelets: 165 10*3/uL (ref 150–400)
RBC: 4.86 MIL/uL (ref 4.22–5.81)
RDW: 12.1 % (ref 11.5–15.5)
WBC: 5.6 10*3/uL (ref 4.0–10.5)
nRBC: 0 % (ref 0.0–0.2)

## 2021-06-05 LAB — COMPREHENSIVE METABOLIC PANEL
ALT: 19 U/L (ref 0–44)
AST: 30 U/L (ref 15–41)
Albumin: 4.5 g/dL (ref 3.5–5.0)
Alkaline Phosphatase: 67 U/L (ref 38–126)
Anion gap: 10 (ref 5–15)
BUN: 18 mg/dL (ref 6–20)
CO2: 24 mmol/L (ref 22–32)
Calcium: 9.4 mg/dL (ref 8.9–10.3)
Chloride: 105 mmol/L (ref 98–111)
Creatinine, Ser: 1.17 mg/dL (ref 0.61–1.24)
GFR, Estimated: >60 mL/min (ref >60)
Glucose, Bld: 128 mg/dL — ABNORMAL HIGH (ref 70–99)
Potassium: 3.8 mmol/L (ref 3.5–5.1)
Sodium: 139 mmol/L (ref 135–145)
Total Bilirubin: 0.6 mg/dL (ref 0.3–1.2)
Total Protein: 7.2 g/dL (ref 6.5–8.1)

## 2021-06-05 LAB — LIPASE, BLOOD: Lipase: 31 U/L (ref 11–51)

## 2021-06-05 MED ORDER — OXYCODONE-ACETAMINOPHEN 5-325 MG PO TABS: 2.0000 | ORAL_TABLET | Freq: Once | ORAL | Status: DC

## 2021-06-05 MED ORDER — IBUPROFEN 600 MG PO TABS: 600.0000 mg | ORAL_TABLET | Freq: Three times a day (TID) | ORAL | 0 refills | Status: DC | PRN

## 2021-06-05 MED ORDER — TETANUS-DIPHTH-ACELL PERTUSSIS 5-2.5-18.5 LF-MCG/0.5 IM SUSY
0.5000 mL | PREFILLED_SYRINGE | Freq: Once | INTRAMUSCULAR | Status: DC
  Filled 2021-06-05: qty 0.5

## 2021-06-05 MED ORDER — CEPHALEXIN 500 MG PO CAPS: 500.0000 mg | ORAL_CAPSULE | Freq: Three times a day (TID) | ORAL | 0 refills | Status: AC

## 2021-06-05 MED ORDER — HYDROCODONE-ACETAMINOPHEN 5-325 MG PO TABS: 1.0000 | ORAL_TABLET | Freq: Four times a day (QID) | ORAL | 0 refills | Status: DC | PRN

## 2021-06-05 MED ORDER — MORPHINE SULFATE (PF) 4 MG/ML IV SOLN
6.0000 mg | Freq: Once | INTRAVENOUS | Status: DC
Start: 2021-06-05 — End: 2021-06-05
  Filled 2021-06-05: qty 2

## 2021-06-05 MED ORDER — LIDOCAINE-EPINEPHRINE 2 %-1:100000 IJ SOLN
20.0000 mL | Freq: Once | INTRAMUSCULAR | Status: DC
  Filled 2021-06-05: qty 1

## 2021-06-05 MED ORDER — MORPHINE SULFATE (PF) 4 MG/ML IV SOLN
6.0000 mg | Freq: Once | INTRAVENOUS | Status: AC
  Administered 2021-06-05: 6 mg via INTRAVENOUS

## 2021-06-05 MED ORDER — ONDANSETRON HCL 4 MG/2ML IJ SOLN
4.0000 mg | Freq: Once | INTRAMUSCULAR | Status: DC
  Filled 2021-06-05: qty 2

## 2021-06-05 MED ORDER — ONDANSETRON 4 MG PO TBDP: 4.0000 mg | ORAL_TABLET | Freq: Once | ORAL | Status: DC

## 2021-06-05 MED ORDER — ONDANSETRON HCL 4 MG/2ML IJ SOLN
4.0000 mg | Freq: Once | INTRAMUSCULAR | Status: AC
  Administered 2021-06-05: 4 mg via INTRAVENOUS

## 2021-06-05 MED ORDER — CEPHALEXIN 500 MG PO CAPS: 500.0000 mg | ORAL_CAPSULE | Freq: Three times a day (TID) | ORAL | 0 refills | Status: DC
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
## 2021-06-05 NOTE — ED Notes (Signed)
Pt verbalized understanding.
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
## 2021-06-05 NOTE — ED Notes (Signed)
Pt transported to CT ?

## 2021-06-19 ENCOUNTER — Other Ambulatory Visit: Payer: Self-pay

## 2021-06-19 ENCOUNTER — Emergency Department
Admission: EM | Admit: 2021-06-19 | Discharge: 2021-06-19 | Disposition: A | Discharge status: Home / Self Care | Payer: Self-pay | Attending: Emergency Medicine | Admitting: Emergency Medicine

## 2021-06-19 DIAGNOSIS — Z87891 Personal history of nicotine dependence: Secondary | ICD-10-CM | POA: Insufficient documentation

## 2021-06-19 DIAGNOSIS — S0101XD Laceration without foreign body of scalp, subsequent encounter: Secondary | ICD-10-CM | POA: Insufficient documentation

## 2021-06-19 DIAGNOSIS — Z4802 Encounter for removal of sutures: Secondary | ICD-10-CM | POA: Insufficient documentation

## 2021-06-19 DIAGNOSIS — X58XXXD Exposure to other specified factors, subsequent encounter: Secondary | ICD-10-CM | POA: Insufficient documentation

## 2021-06-19 NOTE — ED Triage Notes (Signed)
Pt states he is here ot have staples removed from his head this morning,
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
## 2023-11-24 ENCOUNTER — Other Ambulatory Visit: Payer: Self-pay

## 2023-11-24 ENCOUNTER — Emergency Department (HOSPITAL_COMMUNITY)
Admission: EM | Admit: 2023-11-24 | Discharge: 2023-11-24 | Disposition: A | Discharge status: Home / Self Care | Payer: Medicaid Other | Attending: Emergency Medicine | Admitting: Emergency Medicine

## 2023-11-24 ENCOUNTER — Encounter (HOSPITAL_COMMUNITY): Payer: Self-pay

## 2023-11-24 ENCOUNTER — Emergency Department (HOSPITAL_COMMUNITY): Payer: Medicaid Other

## 2023-11-24 DIAGNOSIS — M79606 Pain in leg, unspecified: Secondary | ICD-10-CM | POA: Diagnosis present

## 2023-11-24 DIAGNOSIS — Z20822 Contact with and (suspected) exposure to covid-19: Secondary | ICD-10-CM | POA: Diagnosis not present

## 2023-11-24 DIAGNOSIS — J101 Influenza due to other identified influenza virus with other respiratory manifestations: Secondary | ICD-10-CM | POA: Insufficient documentation

## 2023-11-24 DIAGNOSIS — M791 Myalgia, unspecified site: Secondary | ICD-10-CM | POA: Insufficient documentation

## 2023-11-24 LAB — CBC
HCT: 41.2 % (ref 39.0–52.0)
Hemoglobin: 13.8 g/dL (ref 13.0–17.0)
MCH: 29.7 pg (ref 26.0–34.0)
MCHC: 33.5 g/dL (ref 30.0–36.0)
MCV: 88.6 fL (ref 80.0–100.0)
Platelets: 148 10*3/uL — ABNORMAL LOW (ref 150–400)
RBC: 4.65 MIL/uL (ref 4.22–5.81)
RDW: 12.2 % (ref 11.5–15.5)
WBC: 5.3 10*3/uL (ref 4.0–10.5)
nRBC: 0 % (ref 0.0–0.2)

## 2023-11-24 LAB — BASIC METABOLIC PANEL
Anion gap: 12 (ref 5–15)
BUN: 16 mg/dL (ref 6–20)
CO2: 24 mmol/L (ref 22–32)
Calcium: 9.2 mg/dL (ref 8.9–10.3)
Chloride: 103 mmol/L (ref 98–111)
Creatinine, Ser: 1.06 mg/dL (ref 0.61–1.24)
GFR, Estimated: >60 mL/min (ref >60)
Glucose, Bld: 88 mg/dL (ref 70–99)
Potassium: 3.6 mmol/L (ref 3.5–5.1)
Sodium: 139 mmol/L (ref 135–145)

## 2023-11-24 LAB — CK: Total CK: 139 U/L (ref 49–397)

## 2023-11-24 LAB — TROPONIN I (HIGH SENSITIVITY)
Troponin I (High Sensitivity): 3 ng/L (ref <18)
Troponin I (High Sensitivity): 3 ng/L (ref <18)

## 2023-11-24 LAB — RESP PANEL BY RT-PCR (RSV, FLU A&B, COVID)  RVPGX2
Influenza A by PCR: POSITIVE — AB
Influenza B by PCR: NEGATIVE
Resp Syncytial Virus by PCR: NEGATIVE
SARS Coronavirus 2 by RT PCR: NEGATIVE

## 2023-11-24 MED ORDER — IBUPROFEN 400 MG PO TABS
600.0000 mg | ORAL_TABLET | Freq: Once | ORAL | Status: AC
  Administered 2023-11-24: 600 mg via ORAL
  Filled 2023-11-24: qty 1

## 2023-11-24 NOTE — ED Notes (Signed)
Verbal consent given for mse
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
## 2023-11-24 NOTE — Discharge Instructions (Addendum)
You were seen in the emergency department due to pain in your legs.  Test performed while you are here included EKG, chest x-ray, blood work and viral testing.  These tests show that you are positive for influenza A.  This is likely the cause of your muscle aches.  We did check a test to look for signs of inflammation of your muscles and this was within normal limits. You can take Tylenol and ibuprofen as needed for pain.  Can additionally try applying ice to your legs for 20 minutes several times a day for the next couple of days.  You may benefit from obtaining an over-the-counter anti-inflammatory such as Voltaren gel. Please follow-up with your PCP in the next 2-3 days if you are experiencing ongoing symptoms. If you experience significantly increased pain, any numbness or tingling, rashes, loss of consciousness, or any other concerns, return to the ED for reevaluation.

## 2023-11-24 NOTE — ED Notes (Signed)
CK added on to last trop

## 2023-11-24 NOTE — ED Provider Triage Note (Signed)
Emergency Medicine Provider Triage Evaluation Note  Andrew Brewer , a 33 y.o. male  was evaluated in triage.  Pt complains of CP x 53yr/URI Sx x 2 days  .  Review of Systems  Positive: Fever, chills, body aches, intermittent CP, SHOB with exertion, N Negative: Abd pain, V/D, weakness  Physical Exam  BP 137/80   Pulse 86   Temp 98.3 F (36.8 C) (Oral)   Resp 16   Wt 90.7 kg   SpO2 98%   BMI 29.53 kg/m  Gen:   Awake, no distress   Resp:  Normal effort  MSK:   Moves extremities without difficulty  Other:    Medical Decision Making  Medically screening exam initiated at 12:37 PM.  Appropriate orders placed.  Elan Brainerd was informed that the remainder of the evaluation will be completed by another provider, this initial triage assessment does not replace that evaluation, and the importance of remaining in the ED until their evaluation is complete.  Labs/imaging ordered   Dolphus Jenny, PA-C 11/24/23 1256

## 2023-11-24 NOTE — ED Provider Notes (Incomplete)
[place] EMERGENCY DEPARTMENT AT [facility] Provider Note  MDM   HPI/ROS:  Andrew Brewer is a 33 y.o. male with pertinent past medical history of headaches, chronic atypical chest pain who presents for evaluation of leg pain. Patient reports he initially began to experience fevers up to 102F as well as cough, congestion, rhinorrhea 3 days ago.  Since last night he has additionally had severe pain in his bilateral thighs.  He denies any trauma or recent heavy lifting.  He is not taking any medication for pain.  He is experiencing some intermittent chest pain, but he states this has been ongoing for years and is unchanged from prior.  He denies any associated dyspnea, numbness, tingling and he has been able to urinate without difficulty.  No new rashes.  He has never had similar leg pain in the past.  Physical exam is notable for: - 4+/5 strength with flexion at the knee, hip flexion limited by pain.   - Otherwise 5/5 strength in bilateral lower extremities - Sensation intact to light touch throughout.   - Tenderness to palpation over the anterior > posterior thighs bilaterally  On my initial evaluation, patient is:  -Vital signs stable. Patient afebrile, hemodynamically stable, and non-toxic appearing. -Labs reviewed: WBC 5.3, hemoglobin 13.8, creatinine 1.06, normal electrolytes, normal troponin x 2 -Influenza A positive -EKG with normal sinus rhythm, no STEMI, Nonspecific ST-T wave changes -Chest x-ray with no evidence of pneumonia, pneumothorax, pulmonary edema  This patient's current presentation, including their history and physical exam, is most consistent with myalgias secondary to influenza A infection.  CK level was checked as well and normal, reassuring against any postviral myositis.  No rashes that would concern for shingles and less likely given bilateral.  No evidence of trauma, deformities, overlying skin infection.  Able to ambulate with antalgic gait.  Patient  reporting chest pain has been chronic and ongoing for at least 3 years and is not a concern to him at this time.  Chest pain workup further reassuring and HEAR score of 2, no further workup indicated at this time.  Patient was given dose of ibuprofen in the ED.  He was advised on supportive care and recommended to follow-up with his PCP for duration if no improvement in symptoms.  Strict return precautions were provided.  Patient remained stable and had no acute events while in my care in the emergency department.  Disposition:  I discussed the plan for discharge with the patient and/or their surrogate at bedside prior to discharge and they were in agreement with the plan and verbalized understanding of the return precautions provided. All questions answered to the best of my ability. Ultimately, the patient was discharged in stable condition with stable vital signs. I am reassured that they are capable of close follow up and good social support at home.   Clinical Impression:  1. Influenza A   2. Myalgia     Rx / DC Orders ED Discharge Orders     None       The plan for this patient was discussed with Dr. Rosalia Hammers, who voiced agreement and who oversaw evaluation and treatment of this patient.   Clinical Complexity A medically appropriate history, review of systems, and physical exam was performed.  My independent interpretations of EKG, labs, and radiology are documented in the ED course above.   If decision rules were used in this patient's evaluation, they are listed below.  HEAR 2  Patient's presentation is most consistent  with acute presentation with potential threat to life or bodily function.  Medical Decision Making Amount and/or Complexity of Data Reviewed Labs: ordered. Radiology: ordered.    HPI/ROS      See MDM section for pertinent HPI and ROS. A complete ROS was performed with pertinent positives/negatives noted above.   History reviewed. No pertinent past  medical history.  Past Surgical History:  Procedure Laterality Date   I & D EXTREMITY Right 07/29/2019   Procedure: IRRIGATION AND DEBRIDEMENT INDEX FINGER AND REPAIR SKIN AND NAILBED LACERATIONS;  Surgeon: Betha Loa, MD;  Location: [facility] OR;  Service: Orthopedics;  Laterality: Right;      Physical Exam   Vitals:   11/24/23 1200 11/24/23 1204 11/24/23 1643  BP: 137/80  127/74  Pulse: 86  75  Resp: 16  17  Temp: 98.3 F (36.8 C)  98.4 F (36.9 C)  TempSrc: Oral    SpO2: 98%  99%  Weight:  90.7 kg     Physical Exam Gen: NAD. Appears comfortable HENT: Conjunctiva clear, PERRL, EOMI. MMM.  CV: RRR. No M/R/G Pulm: Lungs CTAB with no wheezing, rales, or rhonchi.  GI: Abdomen soft, non-tender, non-distended. Normal bowel sounds in all 4 quadrants. MSK/Skin: No lower extremity edema. Extremities warm, well-perfused with 2+ pulses in all 4 extremities.   Tenderness to palpation over the anterior > posterior thighs bilaterally.  No overlying rashes.  Pelvis stable to anterior lateral compression.  No tenderness to palpation over the hip or the knee. Neuro: A&Ox3. GCS 15. Moves all extremities. 4+/5 strength with flexion at the knee, hip flexion limited by pain.  Otherwise 5/5 strength in bilateral lower extremities.  Sensation intact to light touch throughout.  Antalgic gait.    Procedures   If procedures were preformed on this patient, they are listed below:  Procedures   Mikeal Hawthorne, MD Emergency Medicine PGY-2   Please note that this documentation was produced with the assistance of voice-to-text technology and may contain errors.    Mikeal Hawthorne, MD 11/25/23 [phone]
